# Patient Record
Sex: Female | Born: 1990 | Race: White | Hispanic: No | Marital: Married
Health system: Midwestern US, Community
[De-identification: ages and names within clinical notes are randomized; demographics above are authoritative.]

## PROBLEM LIST (undated history)

## (undated) DIAGNOSIS — F4310 Post-traumatic stress disorder, unspecified: Secondary | ICD-10-CM

## (undated) DIAGNOSIS — J310 Chronic rhinitis: Secondary | ICD-10-CM

## (undated) DIAGNOSIS — J329 Chronic sinusitis, unspecified: Secondary | ICD-10-CM

## (undated) HISTORY — PX: TONSILLECTOMY: SUR1361

---

## 2010-06-30 LAB — URINALYSIS WITH MICROSCOPIC
Bilirubin, Urine: NEGATIVE
Blood, Urine: NEGATIVE
CASTS 2: NONE SEEN /lpf
Crystals, UA: NONE SEEN
Glucose, Ur: NEGATIVE mg/dl
Ketones, Urine: NEGATIVE
Leukocyte Esterase, Urine: NEGATIVE
MISCELLANEOUS 2: NONE SEEN
Nitrite, Urine: NEGATIVE
Protein, UA: NEGATIVE
Renal Epithelial, UA: NONE SEEN
Specific Gravity, Urine: 1.021 (ref 1.002–1.030)
Urobilinogen, Urine: 0.2 eu/dl (ref 0.0–1.0)
Yeast, UA: NONE SEEN
pH, UA: 7 (ref 5.0–9.0)

## 2010-06-30 LAB — CBC
Basophils: 0.3 % (ref 0.0–3.0)
Eosinophils: 1.1 % (ref 0.0–4.0)
Hematocrit: 40.4 % (ref 37.0–47.0)
Hemoglobin: 13.6 gm/dl (ref 12.0–16.0)
Lymphocytes: 31.8 % (ref 15.0–47.0)
MCH: 29.5 pg (ref 27.0–31.0)
MCHC: 33.7 gm/dl (ref 33.0–37.0)
MCV: 87.6 fL (ref 81.0–99.0)
MPV: 9.1 mcm (ref 7.4–10.4)
Monocytes: 8.6 % (ref 0.0–12.0)
Nucleated Red Blood Cells: 0 /100 wbc
Platelets: 254 10*3/uL (ref 130–400)
RBC Morphology: NORMAL
RBC: 4.61 10*6/uL (ref 4.20–5.40)
RDW: 14.3 % (ref 11.5–14.5)
Seg Neutrophils: 58.2 % (ref 43.0–75.0)
WBC: 7.6 10*3/uL (ref 4.8–10.8)

## 2010-06-30 LAB — BASIC METABOLIC PANEL WITHOUT CALCIUM
BUN, WHOLE BLOOD: 10 mg/dl (ref 7–22)
CO2, WHOLE BLOOD: 22 meq/l — ABNORMAL LOW (ref 23–33)
CREATININE, WHOLE BLOOD: 1.2 mg/dl (ref 0.5–1.2)
Chloride, Whole Blood: 107 meq/l (ref 98–111)
Glucose, Whole Blood: 74 mg/dl (ref 70–108)
Potassium, Whole Blood: 3.6 meq/l (ref 3.5–5.2)
Sodium, Whole Blood: 142 meq/l (ref 135–145)

## 2010-06-30 LAB — ANION GAP: Anion Gap: 20 (ref 10.0–20.0)

## 2010-06-30 LAB — OSMOLALITY: Osmolality Calc: 281 (ref 275–300)

## 2010-06-30 LAB — HCG, SERUM, QUALITATIVE: Preg, Serum: NEGATIVE

## 2010-07-04 LAB — CHLAMYDIA CULTURE

## 2012-01-18 MED ORDER — DEXLANSOPRAZOLE 60 MG PO CPDR
60 MG | ORAL_CAPSULE | Freq: Every day | ORAL | Status: DC
Start: 2012-01-18 — End: 2012-01-25

## 2012-01-18 NOTE — Progress Notes (Addendum)
Subjective:      Patient ID: Jennifer Burch is a 21 y.o. female.  Chief Complaint   Patient presents with   ??? Poison Lajoyce [place]     has really bad poison ivy on her right fore arm and is spreading onto the left arm   ??? Abdominal Pain     states of being diagnosed down in [place] of having gastritis. is using Prilosec and that is not really helping   ??? Referral - General     would like to go and see someone for her gastro problems         HPI here for stomach pain  , bleeding rectally, dark red for the past 3 to 4 months diagnosed as gastritis, nothing has been scoped yet ,fine rash on her forearms for the past 2 weeks, pain on palpation of the midepigastric area BS is positive  Patient's medications, allergies, past medical, surgical, social and family histories were reviewed and updated as appropriate.    Filed Vitals:    01/18/12 1410   BP: 120/70   Pulse: 84   Temp: 98.8 ??F (37.1 ??C)   TempSrc: Oral   Resp: 18   Height: 5\' 4"  (1.626 m)   Weight: 135 lb (61.236 kg)       Review of Systems   Constitutional: Negative.    HENT: Negative.    Eyes: Negative.    Respiratory: Negative.    Cardiovascular: Negative.    Gastrointestinal: Positive for abdominal pain, blood in stool, abdominal distention and rectal pain.   Endocrine: Negative.    Musculoskeletal: Negative.    Skin: Negative.    Allergic/Immunologic: Negative.    Neurological: Negative.    Hematological: Negative.    Psychiatric/Behavioral: Negative.    All other systems reviewed and are negative.        Objective:   Physical Exam   Nursing note and vitals reviewed.  Constitutional: She is oriented to person, place, and time. Vital signs are normal. She appears well-developed and well-nourished.   HENT:   Head: Normocephalic and atraumatic.   Right Ear: Hearing, tympanic membrane, external ear and ear canal normal.   Left Ear: Hearing, tympanic membrane, external ear and ear canal normal.   Nose: Nose normal.   Mouth/Throat: Uvula is midline, oropharynx is clear and  moist and mucous membranes are normal.   Eyes: Conjunctivae and EOM are normal. Pupils are equal, round, and reactive to light.   Neck: Normal range of motion. Neck supple.   Cardiovascular: Normal rate, regular rhythm, S1 normal, S2 normal, normal heart sounds and intact distal pulses.  Exam reveals no gallop and no friction rub.    No murmur heard.  Pulmonary/Chest: Effort normal and breath sounds normal. She has no wheezes. She has no rhonchi. She has no rales.   Abdominal: Soft. Bowel sounds are normal. She exhibits distension. There is tenderness. There is guarding. There is no rebound.   Musculoskeletal: Normal range of motion.   Neurological: She is alert and oriented to person, place, and time. She has normal reflexes.   Skin: Skin is warm, dry and intact.   Psychiatric: She has a normal mood and affect. Her behavior is normal. Judgment and thought content normal.       Assessment:      Acute abdominal pain, rectal bleeding      Plan:      Decadron 4 mg,+ B12,1000 mcg, lab work ordered, Dexilant 60 mg daily, calmine on the  rash rtc tomorrow until she gets blood drawn

## 2012-01-18 NOTE — Telephone Encounter (Signed)
Samples of Dexilant 60 mg were given to the patient, Lot Number [id number], 08/2013 #5, lot # [id number] 08/2012 #10, lot# [id number] 09/2012 #5.

## 2012-01-18 NOTE — Progress Notes (Signed)
Have you seen any other physician or provider since your last visit no    Have you had any other diagnostic tests since your last visit? no    Have you changed or stopped any medications since your last visit including any over-the-counter medicines, vitamins, or herbal medicines? no     Are you taking all your prescribed medications? Yes    If NO, why?

## 2012-01-18 NOTE — Addendum Note (Signed)
Addended by: Rudene ChristiansBURNER, MELONIE on: 01/18/2012 02:43 PM     Modules accepted: Orders

## 2012-01-25 MED ORDER — DEXLANSOPRAZOLE 60 MG PO CPDR
60 MG | ORAL_CAPSULE | Freq: Every day | ORAL | Status: DC
Start: 2012-01-25 — End: 2012-05-03

## 2012-01-25 MED ORDER — SUCRALFATE 1 G PO TABS
1 GM | ORAL_TABLET | Freq: Four times a day (QID) | ORAL | Status: DC
Start: 2012-01-25 — End: 2012-05-03

## 2012-01-25 MED ORDER — METHYLPREDNISOLONE 4 MG PO TBPK
4 MG | PACK | ORAL | Status: AC
Start: 2012-01-25 — End: 2012-01-31

## 2012-01-25 MED ADMIN — cyanocobalamin injection 1,000 mcg: INTRAMUSCULAR | @ 14:00:00 | NDC 00517003125

## 2012-01-25 MED ADMIN — dexamethasone (DECADRON) injection 4 mg: INTRAMUSCULAR | @ 14:00:00 | NDC 00069454301

## 2012-01-25 NOTE — Progress Notes (Signed)
Subjective:      Patient ID: Jennifer Burch is a 21 y.o. female.  Chief Complaint   Patient presents with   ??? Check-Up     states of being here today for check up from Dexilant, which states is working really well   ??? Fatigue     c/o being really tired and moody all the time. states of feeling like she is PMS"ing all the time       HPIhere for follow up of her severe abdominal pain  That has improved using the Dexilant 60 mg, lab work is all normal ranges, pain scale now 1/10  Patient's medications, allergies, past medical, surgical, social and family histories were reviewed and updated as appropriate.    Filed Vitals:    01/25/12 0902   BP: 120/70   Pulse: 72   Resp: 18       Review of Systems   Constitutional: Negative.    HENT: Negative.    Eyes: Negative.    Respiratory: Negative.    Cardiovascular: Negative.    Gastrointestinal: Negative.  Negative for nausea, vomiting, abdominal pain, abdominal distention and rectal pain.   Endocrine: Negative.    Genitourinary: Negative.    Musculoskeletal: Negative.    Skin: Positive for color change, pallor and rash.   Allergic/Immunologic: Negative.    Neurological: Negative.    Hematological: Negative.    Psychiatric/Behavioral: Negative.    All other systems reviewed and are negative.        Objective:   Physical Exam   Nursing note and vitals reviewed.  Constitutional: She is oriented to person, place, and time. Vital signs are normal. She appears well-developed and well-nourished.   HENT:   Head: Normocephalic and atraumatic.   Right Ear: Hearing, tympanic membrane, external ear and ear canal normal.   Left Ear: Hearing, tympanic membrane, external ear and ear canal normal.   Nose: Nose normal.   Mouth/Throat: Uvula is midline, oropharynx is clear and moist and mucous membranes are normal.   Eyes: Conjunctivae and EOM are normal. Pupils are equal, round, and reactive to light.   Neck: Normal range of motion. Neck supple.   Cardiovascular: Normal rate, regular rhythm, S1  normal, S2 normal, normal heart sounds and intact distal pulses.  Exam reveals no gallop and no friction rub.    No murmur heard.  Pulmonary/Chest: Effort normal and breath sounds normal. She has no wheezes. She has no rhonchi. She has no rales.   Abdominal: Soft. Bowel sounds are normal. She exhibits no distension. There is no tenderness. There is no rebound and no guarding.   Musculoskeletal: Normal range of motion.   Neurological: She is alert and oriented to person, place, and time. She has normal reflexes.   Skin: Skin is warm, dry and intact. Rash noted. There is erythema.   Psychiatric: She has a normal mood and affect. Her behavior is normal. Judgment and thought content normal.       Assessment:      Acute gastritis, dermatitis of the arms      Plan:      Decadron 4 mg + B12,1000 mcg Im, medrol 4 mg dosepack

## 2012-01-25 NOTE — Telephone Encounter (Signed)
Patient is requesting a medication refill for pended orders. Patient was in office today and forgot to ask for refills

## 2012-01-25 NOTE — Progress Notes (Signed)
Have you seen any other physician or provider since your last visit no    Have you had any other diagnostic tests since your last visit? no    Have you changed or stopped any medications since your last visit including any over-the-counter medicines, vitamins, or herbal medicines? no     Are you taking all your prescribed medications? Yes    If NO, why?

## 2012-01-25 NOTE — Progress Notes (Signed)
After obtaining consent, and per orders of Dr. Biery, injection of vitamin b12 along with dexamethasone given into right gluteal by Melissa D Offenbacher.

## 2012-04-24 NOTE — Telephone Encounter (Signed)
x1 attempt to call patient, but the voice keeps saying "Sorry your call cannot be completed as dialed."

## 2012-04-24 NOTE — Telephone Encounter (Signed)
We should see her

## 2012-04-24 NOTE — Telephone Encounter (Signed)
Mom called into office voicemail stating that patient is having stomach pains and issues with her stomach again and doesn't know what to do? Do you want her to come in for an appt or should she go to the ER? She wanted a phone call back as soon as we could. [phone]    Attempted to call patient back, but mail box is full, so I was unable to leave a message. So will await their phone call back.

## 2012-05-03 MED ORDER — ONDANSETRON HCL 4 MG PO TABS
4 MG | ORAL_TABLET | Freq: Three times a day (TID) | ORAL | Status: DC | PRN
Start: 2012-05-03 — End: 2015-10-07

## 2012-05-03 MED ORDER — DEXLANSOPRAZOLE 60 MG PO CPDR
60 MG | ORAL_CAPSULE | Freq: Every day | ORAL | Status: DC
Start: 2012-05-03 — End: 2015-10-07

## 2012-05-03 MED ORDER — SUCRALFATE 1 G PO TABS
1 GM | ORAL_TABLET | Freq: Four times a day (QID) | ORAL | Status: DC
Start: 2012-05-03 — End: 2015-10-07

## 2012-05-03 NOTE — Telephone Encounter (Signed)
I reordered the Dexilant 60 mg, Carafate 1gm, & the Zofran 4 mg to be called in down in [place]

## 2012-05-03 NOTE — Telephone Encounter (Signed)
Called [phone] and spoke to the patient. I instructed her that Dr London SheerBiery would like to see her. She was hoping that he could [facility] [place] [address]  I told her I don't think we realized that she was in [place]. I asked her if she had a PCP down there, and she said NO. I instructed her that she might want to start looking for one.    I told her we would be in touch with her with in the next 24 hours.    Please let me know what to do.

## 2012-05-03 NOTE — Telephone Encounter (Signed)
Called the medications ordered by Dr London SheerBiery Dexilant 60 mg, Carafate 1 gm, and Zofran 4 mg [facility] [place]. Spoke with pharmacist Deepa with all the scripts.    1450 pm spoke with patient to let her know that Dr London SheerBiery ordered the above medications and that [facility] [place]. Stated that I had been trying to get a hold of her, and she stated that she had been having problems with her phone, I also stated that I had called her this am, and she stated that she could hear me, but she went through a dead zone, and then she lost me. Then that is when she called back into the office and spoke with Ginny.     Also got patient set up for my chart and stated that where she lived, it would be easy for her to my charted us with any of her information. She stated that would be great and so now she is a my chart patient.

## 2012-05-04 NOTE — Telephone Encounter (Signed)
Error

## 2013-07-11 ENCOUNTER — Emergency Department (HOSPITAL_COMMUNITY)
Admission: EM | Admit: 2013-07-11 | Discharge: 2013-07-11 | Disposition: A | Discharge status: Home / Self Care | Payer: Self-pay | Attending: Emergency Medicine | Admitting: Emergency Medicine

## 2013-07-11 ENCOUNTER — Emergency Department (HOSPITAL_COMMUNITY): Payer: Self-pay

## 2013-07-11 ENCOUNTER — Encounter (HOSPITAL_COMMUNITY): Payer: Self-pay | Admitting: Emergency Medicine

## 2013-07-11 DIAGNOSIS — Z7982 Long term (current) use of aspirin: Secondary | ICD-10-CM | POA: Insufficient documentation

## 2013-07-11 DIAGNOSIS — Z3202 Encounter for pregnancy test, result negative: Secondary | ICD-10-CM | POA: Insufficient documentation

## 2013-07-11 DIAGNOSIS — R1084 Generalized abdominal pain: Secondary | ICD-10-CM

## 2013-07-11 DIAGNOSIS — R52 Pain, unspecified: Secondary | ICD-10-CM | POA: Insufficient documentation

## 2013-07-11 DIAGNOSIS — K5900 Constipation, unspecified: Secondary | ICD-10-CM

## 2013-07-11 DIAGNOSIS — Z79899 Other long term (current) drug therapy: Secondary | ICD-10-CM | POA: Insufficient documentation

## 2013-07-11 DIAGNOSIS — R112 Nausea with vomiting, unspecified: Secondary | ICD-10-CM | POA: Insufficient documentation

## 2013-07-11 DIAGNOSIS — N83209 Unspecified ovarian cyst, unspecified side: Secondary | ICD-10-CM

## 2013-07-11 LAB — URINALYSIS, ROUTINE W REFLEX MICROSCOPIC
Bilirubin Urine: NEGATIVE
Glucose, UA: NEGATIVE mg/dL
Ketones, ur: NEGATIVE mg/dL
Specific Gravity, Urine: 1.03 (ref 1.005–1.030)
pH: 5.5 (ref 5.0–8.0)

## 2013-07-11 LAB — CBC WITH DIFFERENTIAL/PLATELET
Basophils Absolute: 0 10*3/uL (ref 0.0–0.1)
Basophils Relative: 0 % (ref 0–1)
Hemoglobin: 13.5 g/dL (ref 12.0–15.0)
MCHC: 35.2 g/dL (ref 30.0–36.0)
MCV: 86.3 fL (ref 78.0–100.0)
Monocytes Relative: 8 % (ref 3–12)
Neutro Abs: 3.2 10*3/uL (ref 1.7–7.7)
Neutrophils Relative %: 51 % (ref 43–77)
Platelets: 246 10*3/uL (ref 150–400)
RDW: 12.3 % (ref 11.5–15.5)

## 2013-07-11 LAB — COMPREHENSIVE METABOLIC PANEL
AST: 14 U/L (ref 0–37)
Albumin: 3.9 g/dL (ref 3.5–5.2)
Alkaline Phosphatase: 53 U/L (ref 39–117)
BUN: 13 mg/dL (ref 6–23)
CO2: 22 mEq/L (ref 19–32)
Chloride: 104 mEq/L (ref 96–112)
GFR calc non Af Amer: >90 mL/min (ref >90)
Potassium: 3.7 mEq/L (ref 3.5–5.1)
Sodium: 137 mEq/L (ref 135–145)
Total Bilirubin: 0.4 mg/dL (ref 0.3–1.2)
Total Protein: 7 g/dL (ref 6.0–8.3)

## 2013-07-11 LAB — URINE MICROSCOPIC-ADD ON

## 2013-07-11 LAB — POCT PREGNANCY, URINE: Preg Test, Ur: NEGATIVE

## 2013-07-11 LAB — WET PREP, GENITAL: Trich, Wet Prep: NONE SEEN

## 2013-07-11 MED ORDER — ONDANSETRON HCL 4 MG/2ML IJ SOLN
4.0000 mg | Freq: Once | INTRAMUSCULAR | Status: AC
  Administered 2013-07-11: 4 mg via INTRAVENOUS
  Filled 2013-07-11: qty 2

## 2013-07-11 MED ORDER — ELETRIPTAN HYDROBROMIDE 20 MG PO TABS: 20.0000 mg | ORAL_TABLET | ORAL | Status: AC | PRN

## 2013-07-11 MED ORDER — OXYCODONE-ACETAMINOPHEN 5-325 MG PO TABS: 2.0000 | ORAL_TABLET | ORAL | Status: AC | PRN

## 2013-07-11 MED ORDER — PANTOPRAZOLE SODIUM 40 MG IV SOLR
80.0000 mg | Freq: Once | INTRAVENOUS | Status: AC
  Administered 2013-07-11: 18:00:00 80 mg via INTRAVENOUS
  Filled 2013-07-11: qty 80

## 2013-07-11 MED ORDER — HYDROMORPHONE HCL PF 1 MG/ML IJ SOLN
1.0000 mg | Freq: Once | INTRAMUSCULAR | Status: AC
  Administered 2013-07-11: 1 mg via INTRAVENOUS
  Filled 2013-07-11: qty 1

## 2013-07-11 MED ORDER — IOHEXOL 300 MG/ML  SOLN
100.0000 mL | Freq: Once | INTRAMUSCULAR | Status: AC | PRN
  Administered 2013-07-11: 100 mL via INTRAVENOUS

## 2013-07-11 MED ORDER — IOHEXOL 300 MG/ML  SOLN
50.0000 mL | Freq: Once | INTRAMUSCULAR | Status: AC | PRN
  Administered 2013-07-11: 50 mL via ORAL

## 2013-07-11 MED ORDER — OMEPRAZOLE 20 MG PO CPDR: 20.0000 mg | DELAYED_RELEASE_CAPSULE | Freq: Every day | ORAL | Status: AC

## 2013-07-11 MED ORDER — SODIUM CHLORIDE 0.9 % IV BOLUS (SEPSIS)
500.0000 mL | Freq: Once | INTRAVENOUS | Status: AC
  Administered 2013-07-11: 500 mL via INTRAVENOUS

## 2013-07-11 MED ORDER — ONDANSETRON 8 MG PO TBDP: ORAL_TABLET | ORAL | Status: AC

## 2013-07-11 NOTE — ED Notes (Signed)
Pt c/o RLQ pain that started around 9pm two nights ago. Pt doesn't know if its appendicitis or cyst on her ovary bc she had cyst in past but doenst recall which ovary the cyst was on.  Pt states that she had nausea/vomiting two days ago when pain started but none in last 24 hours.

## 2013-07-11 NOTE — ED Notes (Signed)
Patient transported to CT 

## 2013-07-11 NOTE — ED Provider Notes (Signed)
Medical screening examination/treatment/procedure(s) were conducted as a shared visit with non-physician practitioner(s) and myself.  I personally evaluated the patient during the encounter.  EKG Interpretation   None      No acute abdomen. Pelvic exam normal. CT scan shows no evidence of appendicitis.  Donnetta Hutching, MD 07/11/13 2239

## 2013-07-11 NOTE — ED Notes (Signed)
Drinking oral contrast 

## 2013-07-11 NOTE — ED Provider Notes (Signed)
CSN: [id number]     Arrival date & time 07/11/13  1426 History   First MD Initiated Contact with Patient 07/11/13 1531     Chief Complaint  Patient presents with  . Abdominal Pain   (Consider location/radiation/quality/duration/timing/severity/associated sxs/prior Treatment) The history is provided by the patient and medical records. No language interpreter was used.    Joy Harris is a 22 y.o. female  with a Hx of Migraine, R ovarian cyst presents to the Emergency Department complaining of gradual, persistent, progressively worsening generalized abd pain onset 2 days ago with associated nausea and pink tinged emesis x3 but no frank blood. Pain is different from previous hemorrhagic cyst; beginning in the epigastrium like her "ulcer pain" and then radiating throughout the abd.  Pt reports her diagnosis of PUD was 3 years ago, was due to NSAID usage and she has not been taking the prilosec in the last several month.  Pt also reports she also has a generalized headache consistent with her previous migraines beginning this morning.  Associated symptoms include nausea and vomiting.  Laying down makes it better and moving and walking makes it worse.  Pt denies fever, chills, neck pain, neck stiffness, CP, SOB, diarrhea, bloody stools, weakness, dizziness, syncope, dysuria.  Also denies melena, bright red blood per rectum or bloody stools. LMP Jun 27, 2013  History reviewed. No pertinent past medical history. Past Surgical History  Procedure Laterality Date  . Tonsillectomy     No family history on file. History  Substance Use Topics  . Smoking status: Never Smoker   . Smokeless tobacco: Never Used  . Alcohol Use: No   OB History   Grav Para Term Preterm Abortions TAB SAB Ect Mult Living                 Review of Systems  Constitutional: Negative for fever, diaphoresis, appetite change, fatigue and unexpected weight change.  HENT: Negative for mouth sores and trouble swallowing.    Respiratory: Negative for cough, chest tightness, shortness of breath, wheezing and stridor.   Cardiovascular: Negative for chest pain and palpitations.  Gastrointestinal: Positive for nausea, vomiting and abdominal pain. Negative for diarrhea, constipation, blood in stool, abdominal distention and rectal pain.  Genitourinary: Negative for dysuria, urgency, frequency, hematuria, flank pain and difficulty urinating.  Musculoskeletal: Negative for back pain, neck pain and neck stiffness.  Skin: Negative for rash.  Neurological: Negative for weakness.  Hematological: Negative for adenopathy.  Psychiatric/Behavioral: Negative for confusion.  All other systems reviewed and are negative.    Allergies  Review of patient's allergies indicates no known allergies.  Home Medications   Current Outpatient Rx  Name  Route  Sig  Dispense  Refill  . aspirin 325 MG tablet   Oral   Take 650 mg by mouth daily.         Marland Kitchen eletriptan (RELPAX) 20 MG tablet   Oral   Take 1 tablet (20 mg total) by mouth as needed for migraine or headache. One tablet by mouth at onset of headache. May repeat in 2 hours if headache persists or recurs.   2 tablet   3   . omeprazole (PRILOSEC) 20 MG capsule   Oral   Take 1 capsule (20 mg total) by mouth daily.   30 capsule   0   . ondansetron (ZOFRAN ODT) 8 MG disintegrating tablet      8mg  ODT q4 hours prn nausea   10 tablet   0   .  oxyCODONE-acetaminophen (PERCOCET/ROXICET) 5-325 MG per tablet   Oral   Take 2 tablets by mouth every 4 (four) hours as needed for severe pain.   6 tablet   0    BP 111/65  Pulse 72  Temp(Src) 98.2 F (36.8 C) (Oral)  Resp 16  SpO2 100%  LMP 06/27/2013 Physical Exam  Nursing note and vitals reviewed. Constitutional: She is oriented to person, place, and time. She appears well-developed and well-nourished.  HENT:  Head: Normocephalic and atraumatic.  Mouth/Throat: Oropharynx is clear and moist.  Eyes: Conjunctivae are  normal. No scleral icterus.  Neck: Normal range of motion.  Cardiovascular: Normal rate, regular rhythm, normal heart sounds and intact distal pulses.   No murmur heard. Pulmonary/Chest: Effort normal and breath sounds normal. No respiratory distress. She has no wheezes.  Abdominal: Soft. Normal appearance and bowel sounds are normal. She exhibits no distension and no mass. There is generalized tenderness. There is guarding. There is no rebound and no CVA tenderness. Hernia confirmed negative in the right inguinal area and confirmed negative in the left inguinal area.  Patient exquisitely tender to palpation throughout the abdomen Significant pain in the abdomen with heelstrike No rebound tenderness No CVA tenderness  Genitourinary: There is no rash, tenderness, lesion or injury on the right labia. There is no rash, tenderness, lesion or injury on the left labia. Uterus is not deviated, not enlarged, not fixed and not tender. Cervix exhibits no motion tenderness, no discharge and no friability. Right adnexum displays no mass, no tenderness and no fullness. Left adnexum displays no mass, no tenderness and no fullness. No erythema, tenderness or bleeding around the vagina. No foreign body around the vagina. No signs of injury around the vagina. Vaginal discharge ( minimal, white, non odorous - consistent with leukorrhea) found.  Musculoskeletal: Normal range of motion. She exhibits no edema.  Lymphadenopathy:    She has no cervical adenopathy.       Right: No inguinal adenopathy present.       Left: No inguinal adenopathy present.  Neurological: She is alert and oriented to person, place, and time. No cranial nerve deficit.  Skin: Skin is warm and dry. No rash noted. No erythema.  Psychiatric: She has a normal mood and affect. Her behavior is normal.    ED Course  Procedures (including critical care time) Labs Review Labs Reviewed  WET PREP, GENITAL - Abnormal; Notable for the following:     Clue Cells Wet Prep HPF POC MANY (*)    WBC, Wet Prep HPF POC MANY (*)    All other components within normal limits  URINALYSIS, ROUTINE W REFLEX MICROSCOPIC - Abnormal; Notable for the following:    APPearance CLOUDY (*)    Protein, ur 30 (*)    Leukocytes, UA TRACE (*)    All other components within normal limits  URINE MICROSCOPIC-ADD ON - Abnormal; Notable for the following:    Squamous Epithelial / LPF MANY (*)    Bacteria, UA MANY (*)    Crystals CA OXALATE CRYSTALS (*)    All other components within normal limits  GC/CHLAMYDIA PROBE AMP  CBC WITH DIFFERENTIAL  COMPREHENSIVE METABOLIC PANEL  LIPASE, BLOOD  POCT PREGNANCY, URINE   Imaging Review Ct Abdomen Pelvis W Contrast  07/11/2013   CLINICAL DATA:  Right lower quadrant abdominal pain. Nausea. History of ovarian cysts.  EXAM: CT ABDOMEN AND PELVIS WITH CONTRAST  TECHNIQUE: Multidetector CT imaging of the abdomen and pelvis was performed using the standard  protocol following bolus administration of intravenous contrast.  CONTRAST:  50mL OMNIPAQUE IOHEXOL 300 MG/ML SOLN, OMNIPAQUE IOHEXOL 300 MG/ML SOLN  COMPARISON:  Acute abdomen series same date.  FINDINGS: Lower Chest: Minimal subpleural irregularity at the right lower lobe, likely due to a subpleural lymph node. Normal heart size without pericardial or pleural effusion.  Abdomen/Pelvis: Normal liver, spleen, stomach, pancreas, gallbladder, biliary tract, adrenal glands, kidneys.  Circumaortic left renal vein. No retroperitoneal or retrocrural adenopathy. Colonic stool burden suggests constipation. The cecum extends into the upper pelvis. The appendix may be present on images 53 and 54. No right lower quadrant inflammation seen. Poor enteric opacification of distal small bowel loops an cecum.  Small bowel otherwise within normal limits, without ascites.  No pelvic adenopathy. Normal urinary bladder and uterus. A left ovarian corpus luteal cyst measures 2.3 cm on image 62. Small  volume cul-de-sac fluid could be physiologic or relate to recent cyst rupture.  Bones/Musculoskeletal:  No acute osseous abnormality.  IMPRESSION: 1. Left ovarian corpus luteal cyst. Small volume fluid within the pelvis could be physiologic or due to recent cyst rupture. 2. No other definite explanation for right-sided pain. The appendix is suboptimally visualized, but no definite right lower quadrant inflammation is seen. If there is a high concern of appendicitis, repeat imaging after better opacification of the distal small bowel and colon should be considered. 3.  Possible constipation.   Electronically Signed   By: Jeronimo Greaves M.D.   On: 07/11/2013 19:24   Dg Abd Acute W/chest  07/11/2013   CLINICAL DATA:  Right lower quadrant pain.  EXAM: ACUTE ABDOMEN SERIES (ABDOMEN 2 VIEW & CHEST 1 VIEW)  COMPARISON:  None.  FINDINGS: Mediastinum and hilar structures are normal. Lungs are clear. Heart size normal.  Stool is noted throughout the colon. This could be secondary constipation. No bowel distention or free air. Mild scoliosis concave left noted.  IMPRESSION: 1. Stool noted throughout the colon suggesting constipation. 2. No acute abnormality otherwise noted.  The chest is clear.   Electronically Signed   By: Maisie Fus  Register   On: 07/11/2013 17:36    EKG Interpretation   None       MDM   1. Abdominal pain, acute, generalized   2. Constipation   3. Ovarian cyst rupture      Joy Harris presents with generalized abd pain.  Concern for possible perforation 2/2 PUD vs appendicitis vs colitis.  Will obtain labs and imaging.    4:53 PM Pt without leukocytosis, or other abnormality on bloodwork.   Pelvic exam without adnexal mass or tenderness.  Will redose pain control.  CT scan pending.  8:16 PM UA without evidence of urinary tract infection, CBC, CMP and lipase unremarkable. Patient without leukocytosis. Acute abdomen without evidence of free air or constipation noted. CT scan with small  fluid volume within the pelvis potentially due to recent cyst rupture. Appendix is not well-visualized but there is no evidence of inflammation.  Wet prep with clue cells and many white blood cells however patient without cervical motion tenderness and denies vaginal irritation, vaginal discharge high-risk activities. No new sexual partners and patient is married.  Will send GC chlamydia cultures.  Due to low risk patient and benign pelvic exam will not treat at this time.  Patient is nontoxic, nonseptic appearing, in no apparent distress.  Patient's pain and other symptoms adequately managed in emergency department.  Fluid bolus given.  Labs, imaging and vitals reviewed.  Patient does not  meet the SIRS or Sepsis criteria.  On repeat exam patient does not have a surgical abdomin and there are no peritoneal signs.  No indication of appendicitis, bowel obstruction, bowel perforation, cholecystitis, diverticulitis, PID or ectopic pregnancy.  Patient discharged home with symptomatic treatment and given strict instructions for follow-up with their primary care physician.  I have also discussed reasons to return immediately to the ER.  Patient expresses understanding and agrees with plan.  It has been determined that no acute conditions requiring further emergency intervention are present at this time. The patient/guardian have been advised of the diagnosis and plan. We have discussed signs and symptoms that warrant return to the ED, such as changes or worsening in symptoms and I discussed early appendicitis warnings.   Vital signs are stable at discharge.   BP 111/65  Pulse 72  Temp(Src) 98.2 F (36.8 C) (Oral)  Resp 16  SpO2 100%  LMP 06/27/2013  Patient/guardian has voiced understanding and agreed to follow-up with the PCP or specialist.    The patient was discussed with and seen by Dr. Donnetta Hutching who agrees with the treatment plan.     Dahlia Client Xandra Laramee, PA-C 07/11/13 2020

## 2013-07-12 LAB — GC/CHLAMYDIA PROBE AMP: GC Probe RNA: NEGATIVE

## 2015-03-17 IMAGING — CT CT ABD-PELV W/ CM
1 of 2 series · 15 of 32 positions shown, 19 images · IV contrast (OMNIPAQUE 300)
Comparison: Acute abdomen series same date.

CLINICAL DATA: Right lower quadrant abdominal pain. Nausea. History
of ovarian cysts.

EXAM:
CT ABDOMEN AND PELVIS WITH CONTRAST
TECHNIQUE: Multidetector CT imaging of the abdomen and pelvis was performed
using the standard protocol following bolus administration of
intravenous contrast.
CONTRAST:  50mL OMNIPAQUE IOHEXOL 300 MG/ML SOLN, 100mL OMNIPAQUE
IOHEXOL 300 MG/ML SOLN

[Series 2: abd/pel with · axial · 0.72mm/px · z∈[+1147,+1532]mm · 15 of 85 slices shown, 19 images]
[im 4/85  soft-tissue]
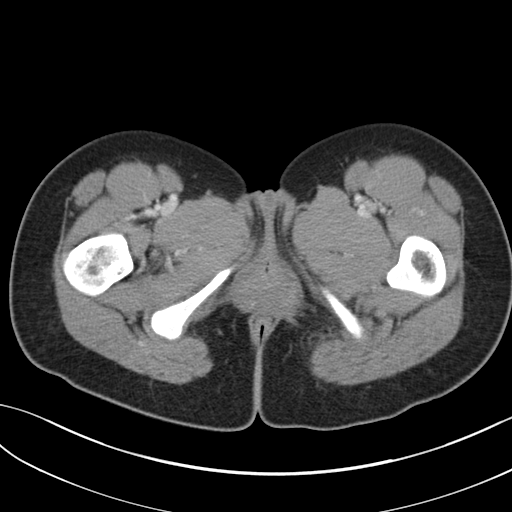
[im 4/85  bone]
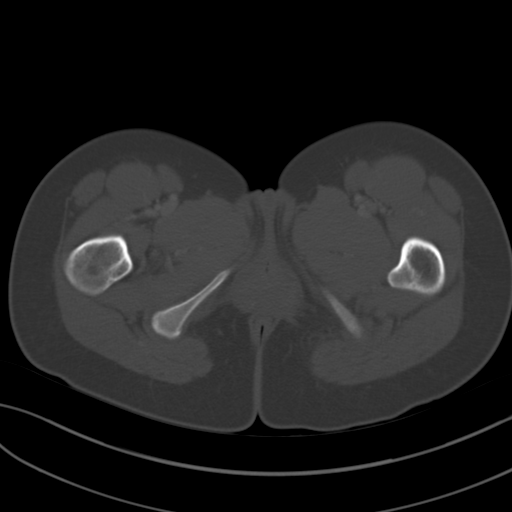
[im 10/85  soft-tissue]
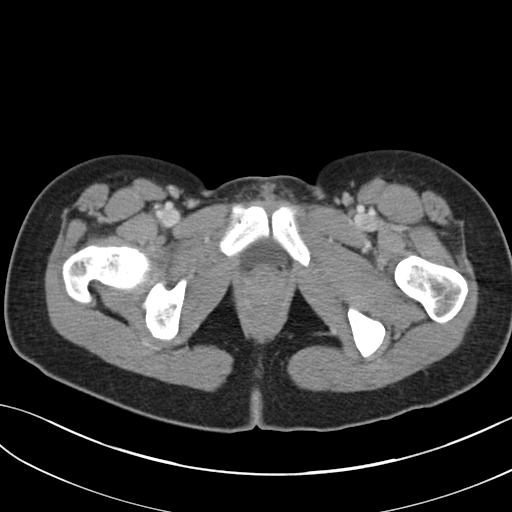
[im 17/85  soft-tissue]
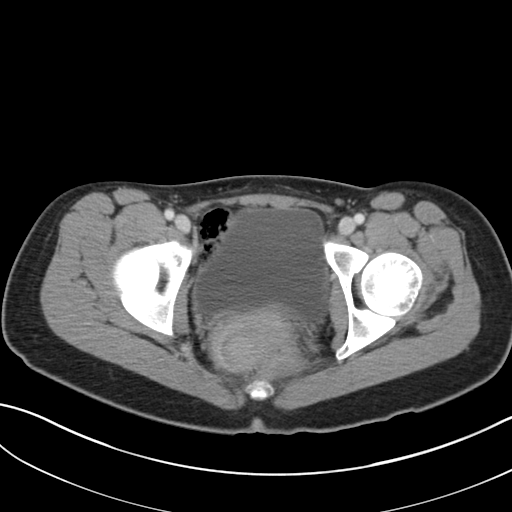
[im 23/85  soft-tissue]
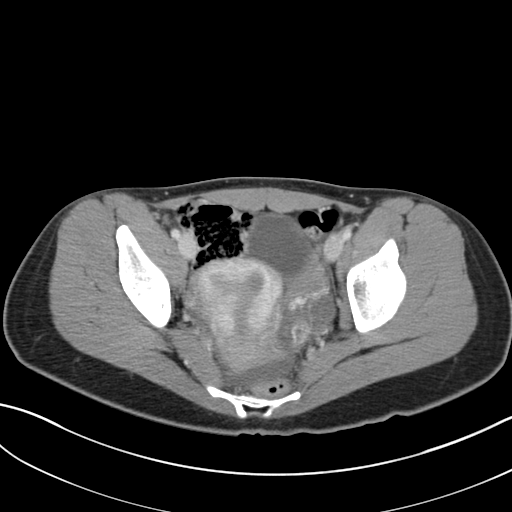
[im 30/85  soft-tissue]
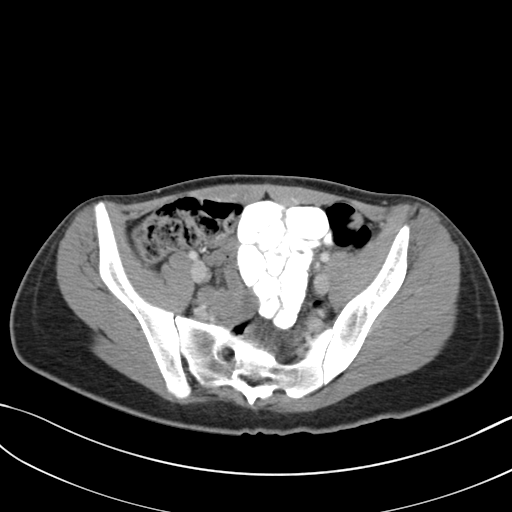
[im 36/85  soft-tissue]
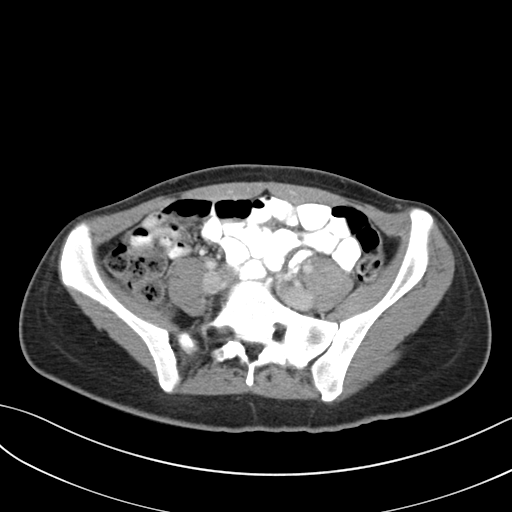
[im 43/85  soft-tissue]
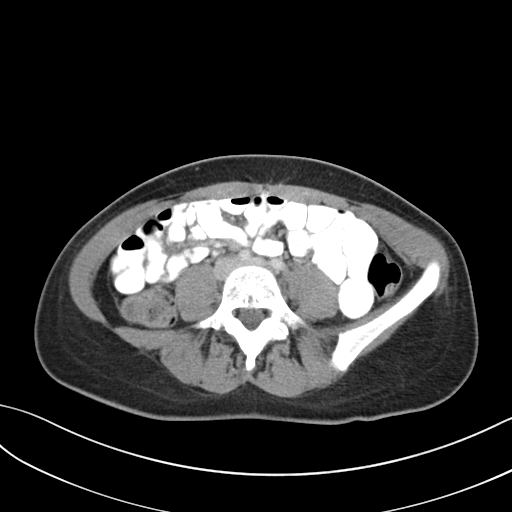
[im 49/85  soft-tissue]
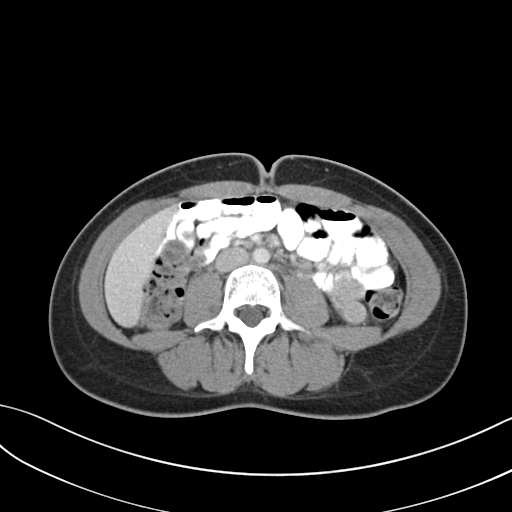
[im 55/85  soft-tissue]
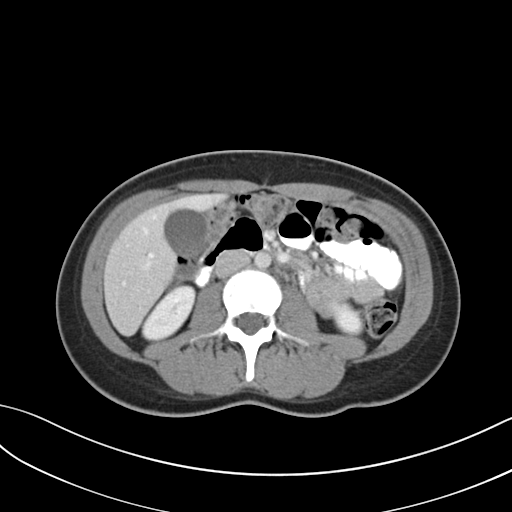
[im 55/85  bone]
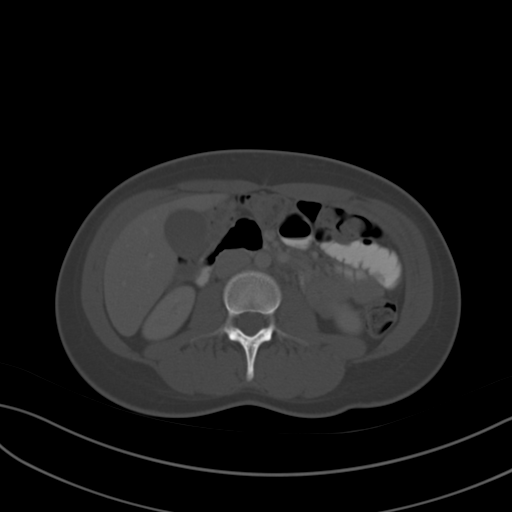
[im 62/85  soft-tissue]
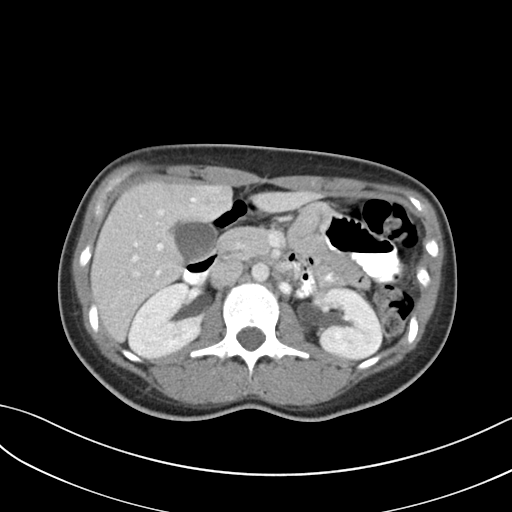
[im 68/85  soft-tissue]
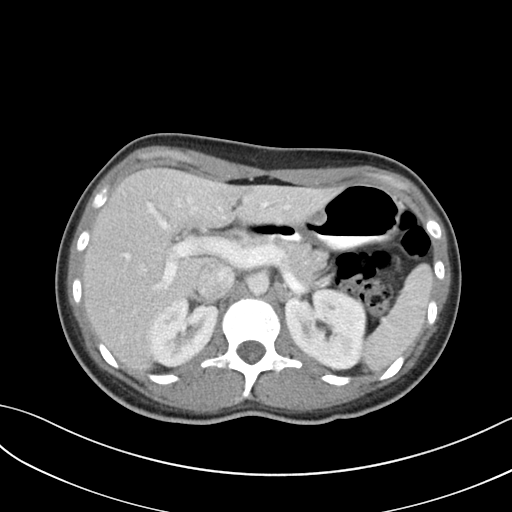
[im 72/85  lung]
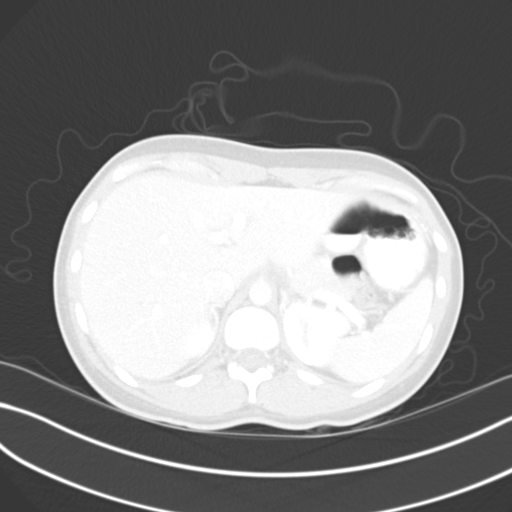
[im 75/85  soft-tissue]
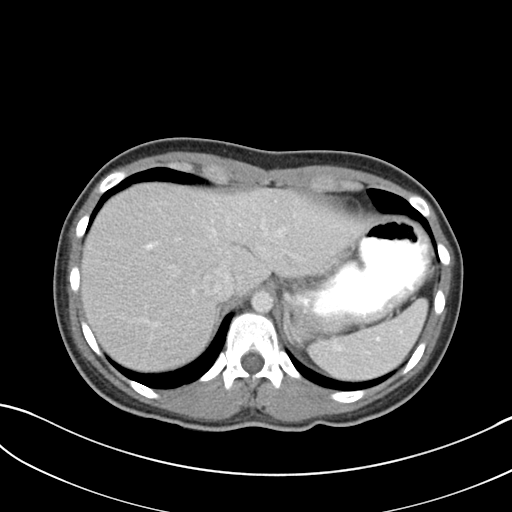
[im 75/85  lung]
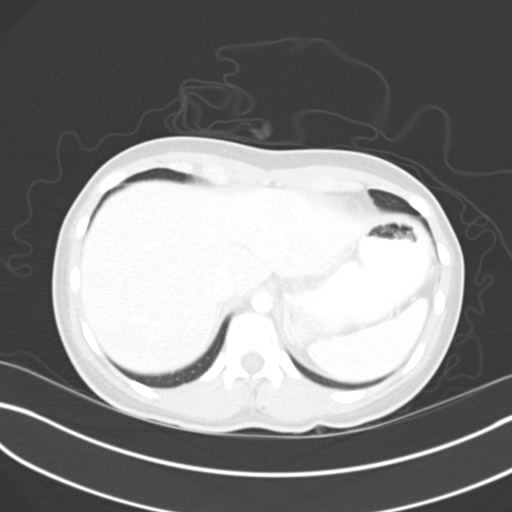
[im 78/85  lung]
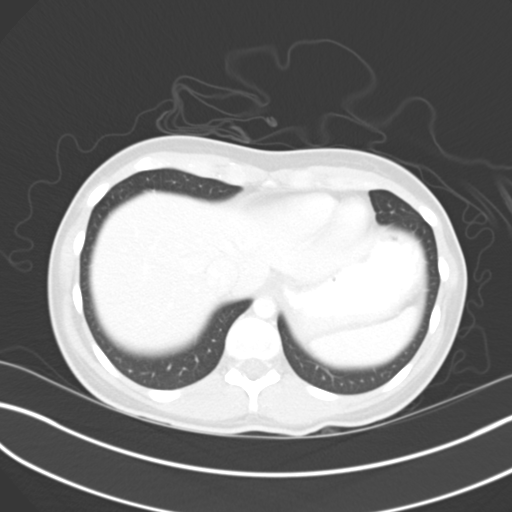
[im 81/85  soft-tissue]
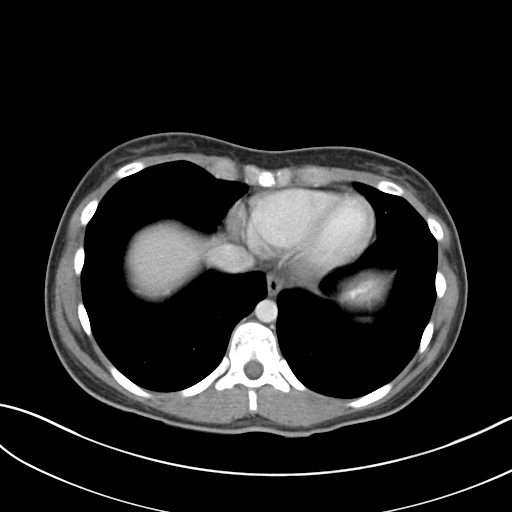
[im 81/85  lung]
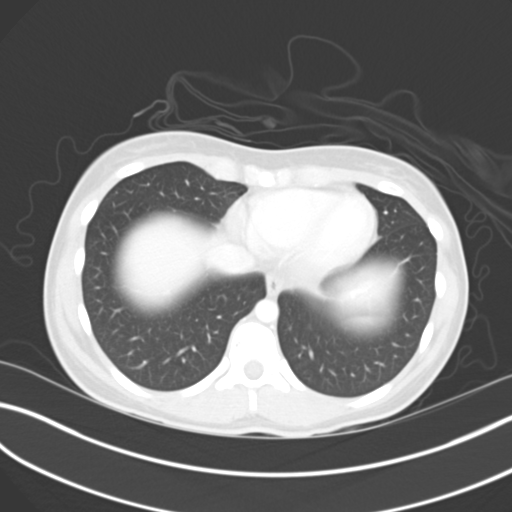

[15 of 32 positions shown; findings below may reference images not displayed]

FINDINGS: Lower Chest: Minimal subpleural irregularity at the right lower
lobe, likely due to a subpleural lymph node. Normal heart size
without pericardial or pleural effusion.

Abdomen/Pelvis: Normal liver, spleen, stomach, pancreas,
gallbladder, biliary tract, adrenal glands, kidneys.

Circumaortic left renal vein. No retroperitoneal or retrocrural
adenopathy. Colonic stool burden suggests constipation. The cecum
extends into the upper pelvis. The appendix may be present on images
53 and 54. No right lower quadrant inflammation seen. Poor enteric
opacification of distal small bowel loops an cecum.

Small bowel otherwise within normal limits, without ascites.

No pelvic adenopathy. Normal urinary bladder and uterus. A left
ovarian corpus luteal cyst measures 2.3 cm on image 62. Small volume
cul-de-sac fluid could be physiologic or relate to recent cyst
rupture.

Bones/Musculoskeletal:  No acute osseous abnormality.
IMPRESSION: 1. Left ovarian corpus luteal cyst. Small volume fluid within the
pelvis could be physiologic or due to recent cyst rupture.
2. No other definite explanation for right-sided pain. The appendix
is suboptimally visualized, but no definite right lower quadrant
inflammation is seen. If there is a high concern of appendicitis,
repeat imaging after better opacification of the distal small bowel
and colon should be considered.
3.  Possible constipation.

## 2015-10-02 ENCOUNTER — Inpatient Hospital Stay: Admit: 2015-10-02 | Discharge: 2015-10-02 | Disposition: A | Discharge status: Home / Self Care

## 2015-10-02 DIAGNOSIS — B9689 Other specified bacterial agents as the cause of diseases classified elsewhere: Secondary | ICD-10-CM

## 2015-10-02 MED ORDER — FLUCONAZOLE 200 MG PO TABS
200 MG | ORAL_TABLET | Freq: Once | ORAL | 0 refills | Status: DC
Start: 2015-10-02 — End: 2015-10-07

## 2015-10-02 MED ORDER — METRONIDAZOLE 250 MG PO TABS
250 MG | ORAL_TABLET | Freq: Three times a day (TID) | ORAL | 0 refills | Status: DC
Start: 2015-10-02 — End: 2015-10-07

## 2015-10-02 NOTE — ED Provider Notes (Signed)
[facility]  Urgent Care Encounter      CHIEF COMPLAINT       Chief Complaint   Patient presents with   ??? Vaginal Itching       Nurses Notes reviewed and I agree except as noted in the HPI.  HISTORY OF PRESENT ILLNESS   Jennifer Burch is a 25 y.o. female who presents Patient is a 25 y.o. female presenting with vaginal discharge. The history is provided by the patient and the spouse.   Vaginal Discharge   Quality:  Watery, yellow, white and thin  Severity:  Moderate  Onset quality:  Sudden  Duration:  5 days  Timing:  Intermittent  Progression:  Worsening  Chronicity:  New  Context: after intercourse, after urination and at rest    Relieved by:  Nothing  Worsened by:  Exercise, position, urination and change in diet  Ineffective treatments:  Rest and OTC medications  Associated symptoms: dyspareunia and vaginal itching    Associated symptoms: no abdominal pain and no dysuria    Risk factors: prior miscarriage and unprotected sex    Risk factors: no new sexual partner, no PID, no STI, no STI exposure and no terminated pregnancy          REVIEW OF SYSTEMS     Review of Systems   Constitutional: Negative.    HENT: Negative.    Eyes: Negative.    Respiratory: Negative.    Cardiovascular: Negative.    Gastrointestinal: Negative for abdominal distention and abdominal pain.   Endocrine: Negative.    Genitourinary: Positive for dyspareunia, urgency and vaginal discharge. Negative for decreased urine volume, dysuria, frequency, genital sores, hematuria, menstrual problem, pelvic pain and vaginal pain.   Musculoskeletal: Negative.    Skin: Negative for pallor and rash.   Allergic/Immunologic: Negative for environmental allergies and food allergies.   Neurological: Negative.    Hematological: Negative for adenopathy.   Psychiatric/Behavioral: Negative.        PAST MEDICAL HISTORY   History reviewed. No pertinent past medical history.    SURGICAL HISTORY     Patient  has no past surgical history on  file.    CURRENT MEDICATIONS       Previous Medications    DEXLANSOPRAZOLE (DEXILANT) 60 MG CPDR CAPSULE    Take 1 capsule by mouth daily.    ONDANSETRON (ZOFRAN) 4 MG TABLET    Take 1 tablet by mouth every 8 hours as needed for Nausea.    SUCRALFATE (CARAFATE) 1 GM TABLET    Take 1 tablet by mouth 4 times daily.       ALLERGIES     Patient is has No Known Allergies.    FAMILY HISTORY     Patient's family history is not on file.    SOCIAL HISTORY     Patient  reports that she has never smoked. She has never used smokeless tobacco. She reports that she does not drink alcohol or use illicit drugs.    PHYSICAL EXAM     ED TRIAGE VITALS  BP: 122/72, Temp: 98.6 ??F (37 ??C), Pulse: 87, Resp: 18, SpO2: 99 %  Physical Exam   Constitutional: She is oriented to person, place, and time. She appears well-developed and well-nourished.   HENT:   Head: Normocephalic.   Nose: Nose normal.   Mouth/Throat: Oropharynx is clear and moist.   Eyes: Right eye exhibits no discharge. Left eye exhibits no discharge. No scleral icterus.   Neck: Normal  range of motion. No thyromegaly present.   Cardiovascular: Normal rate, regular rhythm, normal heart sounds and intact distal pulses.  Exam reveals no gallop and no friction rub.    No murmur heard.  Pulmonary/Chest: Effort normal and breath sounds normal. No stridor. No respiratory distress. She has no wheezes. She has no rales.   Abdominal: Soft. Bowel sounds are normal. She exhibits no distension and no mass. There is no tenderness.   Genitourinary: Vaginal discharge (watery) found.   Musculoskeletal: Normal range of motion. She exhibits no edema or tenderness.   Lymphadenopathy:     She has no cervical adenopathy.   Neurological: She is alert and oriented to person, place, and time.   Skin: Skin is warm and dry. No rash noted. No erythema.   Psychiatric: She has a normal mood and affect. Her behavior is normal. Judgment and thought content normal.   Nursing note and vitals  reviewed.      DIAGNOSTIC RESULTS   Labs:No results found for this visit on 10/02/15.    IMAGING:  No orders to display     URGENT CARE COURSE:     Vitals:    10/02/15 1547   BP: 122/72   Pulse: 87   Resp: 18   Temp: 98.6 ??F (37 ??C)   TempSrc: Temporal   SpO2: 99%   Weight: 138 lb 7.2 oz (62.8 kg)   Height: 5\' 6"  (1.676 m)       Medications - No data to display  PROCEDURES:  None  FINAL IMPRESSION      1. BV (bacterial vaginosis)    2. Vaginal candidiasis        DISPOSITION/PLAN   DISPOSITION Decision to Discharge  PATIENT REFERRED TO:  [facility]  [address]  [place] [address]  [phone]  In 3 days  As needed, If symptoms worsen    DISCHARGE MEDICATIONS:  New Prescriptions    FLUCONAZOLE (DIFLUCAN) 200 MG TABLET    Take 1 tablet by mouth once for 1 dose Take on March 1st, 2017    METRONIDAZOLE (FLAGYL) 250 MG TABLET    Take 1 tablet by mouth 3 times daily for 7 days     Current Discharge Medication List          Metro Kung, CNP        Metro Kung, CNP  10/02/15 1614

## 2015-10-02 NOTE — ED Triage Notes (Signed)
Patient ambulated to room with complaint of vaginal itch, burning and discharge. Patient denies exposure to STD. Patient also complains of rash on left forearm and left finger.

## 2015-10-06 NOTE — Telephone Encounter (Signed)
Patient mom Vernona Rieger is calling stating patient is having migraines bad and would like to become a patient of Eric's, mom is a patient. Please advise mom at [phone].

## 2015-10-06 NOTE — Telephone Encounter (Signed)
Ok to schedule.

## 2015-10-06 NOTE — Telephone Encounter (Signed)
Mom Vernona Rieger was notified and call was transferred to Pre Service to schedule the NP appt.

## 2015-10-07 ENCOUNTER — Ambulatory Visit: Admit: 2015-10-07 | Discharge: 2015-10-07 | Payer: PRIVATE HEALTH INSURANCE | Attending: Family | Primary: Family

## 2015-10-07 DIAGNOSIS — IMO0002 Reserved for concepts with insufficient information to code with codable children: Secondary | ICD-10-CM

## 2015-10-07 MED ORDER — ELETRIPTAN HYDROBROMIDE 40 MG PO TABS
40 MG | ORAL_TABLET | ORAL | 0 refills | Status: DC
Start: 2015-10-07 — End: 2015-11-04

## 2015-10-07 MED ORDER — TOPIRAMATE 25 MG PO TABS
25 MG | ORAL_TABLET | ORAL | 0 refills | Status: DC
Start: 2015-10-07 — End: 2015-11-04

## 2015-10-07 NOTE — Progress Notes (Signed)
Subjective:      Patient ID: Jennifer Burch is a 25 y.o. female.    HPI: NP to establish care. Former PCP was None    Past Medical History   Diagnosis Date   ??? Headache        History reviewed. No pertinent past surgical history.    Family History   Problem Relation Age of Onset   ??? Diabetes Maternal Aunt    ??? Heart Disease Maternal Grandmother        No current outpatient prescriptions on file.     No current facility-administered medications for this visit.        Social: Just moved from [facility]    Smoke: None    Cervical Cancer Screening - 2015    OBGYN - March 8th, 2017    Chief Complaint   Patient presents with   ??? Establish Care     NO previous PCP   ??? Migraine     Weekly, Was treated in the past but unsure regarding what medication or whom she saw       Chronic.  1-2 times per week.   Acute onset sharp pain/spots.  Whole head pain.  Will have N/V.  Light sensitive.  Stabbing pain.  Will on occasion last for 1-2 days.       HA will wake her up from sleep.  Denies thunderclap HA.     BP Readings from Last 3 Encounters:   10/07/15 112/76   10/02/15 122/72   01/25/12 120/70       BP wnl    No results found for: LABA1C  No results found for: EAG    No components found for: CHLPL  No results found for: TRIG  No results found for: HDL  No results found for: LDLCALC  No results found for: LABVLDL      Chemistry        Component Value Date/Time    NA 142 06/29/2010 1945    K 3.6 06/29/2010 1945    CREATININE 1.2 06/29/2010 1945    No results found for: CALCIUM, ALKPHOS, AST, ALT, BILITOT         No results found for: TSH, T3TOTAL, T4TOTAL, THYROIDAB    Lab Results   Component Value Date    WBC 7.6 06/29/2010    HCT 40.4 06/29/2010    MCV 87.6 06/29/2010    PLT 254 06/29/2010         Health Maintenance   Topic Date Due   ??? HIV screen  12/18/2003   ??? DTaP/Tdap/Td vaccine (1 - Tdap) 12/18/2007   ??? Cervical cancer screen  10/27/2014   ??? Flu vaccine (1) 03/09/2016 (Originally 03/10/2015)         There is no immunization history on  file for this patient.    Review of Systems   Constitutional: Negative for chills and fever.   Respiratory: Negative for cough and shortness of breath.    Cardiovascular: Negative for chest pain.   Gastrointestinal: Negative for abdominal pain and nausea.   Skin: Negative for rash.   Neurological: Positive for headaches. Negative for dizziness and light-headedness.   Psychiatric/Behavioral: Negative.        Objective:   Physical Exam   Constitutional: She is oriented to person, place, and time. Vital signs are normal. She appears well-developed and well-nourished. She is active. She does not have a sickly appearance. No distress.   HENT:   Right Ear: Tympanic membrane normal.  Left Ear: Tympanic membrane normal.   Nose: Nose normal.   Mouth/Throat: Oropharynx is clear and moist and mucous membranes are normal.   Cardiovascular: Normal rate, regular rhythm, S1 normal, S2 normal, normal heart sounds and normal pulses.  Exam reveals no S3.    No murmur heard.  Pulmonary/Chest: Effort normal and breath sounds normal. She has no decreased breath sounds. She has no wheezes. She has no rhonchi.   Abdominal: Soft. Bowel sounds are normal. There is no tenderness.   Neurological: She is alert and oriented to person, place, and time. She has normal strength. No cranial nerve deficit or sensory deficit. She displays a negative Romberg sign. Coordination and gait normal.   Psychiatric: She has a normal mood and affect. Her behavior is normal.       Assessment:      1. Chronic migraine  MRI Brain W WO Contrast    topiramate (TOPAMAX) 25 MG tablet    eletriptan (RELPAX) 40 MG tablet   2. Thunderclap headache  MRI Brain W WO Contrast   3. Visual changes  MRI Brain W WO Contrast           Plan:      Pmhx reviewed  MRI Brain - due to changing HA and waking up at HS  Topamax 50 mg HS  Relpax samples trial  Healthy Lifestyle  RTO in 1 month

## 2015-10-07 NOTE — Progress Notes (Signed)
Patient scheduled at [facility] for MRI with and without contrast on 10/14/15 at 12 noon.  Patient to arrive at 11:30am to main radiology.  Patient informed at appt

## 2015-10-07 NOTE — Patient Instructions (Signed)
Migraine Headache: Care Instructions  Your Care Instructions  Migraines are painful, throbbing headaches that often start on one side of the head. They may cause nausea and vomiting and make you sensitive to light, sound, or smell.  Without treatment, migraines can last from 4 hours to a few days. Medicines can help prevent migraines or stop them after they have started. Your doctor can help you find which ones work best for you.  Follow-up care is a key part of your treatment and safety. Be sure to make and go to all appointments, and call your doctor if you are having problems. It's also a good idea to know your test results and keep a list of the medicines you take.  How can you care for yourself at home?  ?? Do not drive if you have taken a prescription pain medicine.  ?? Rest in a quiet, dark room until your headache is gone. Close your eyes, and try to relax or go to sleep. Don't watch TV or read.  ?? Put a cold, moist cloth or cold pack on the painful area for 10 to 20 minutes at a time. Put a thin cloth between the cold pack and your skin.  ?? Use a warm, moist towel or a heating pad set on low to relax tight shoulder and neck muscles.  ?? Have someone gently massage your neck and shoulders.  ?? Take your medicines exactly as prescribed. Call your doctor if you think you are having a problem with your medicine. You will get more details on the specific medicines your doctor prescribes.  ?? Be careful not to take pain medicine more often than the instructions allow. You could get worse or more frequent headaches when the medicine wears off.  To prevent migraines  ?? Keep a headache diary so you can figure out what triggers your headaches. Avoiding triggers may help you prevent headaches. Record when each headache began, how long it lasted, and what the pain was like. (Was it throbbing, aching, stabbing, or dull?) Write down any other symptoms you had with the headache, such as nausea, flashing lights or dark  spots, or sensitivity to bright light or loud noise. Note if the headache occurred near your period. List anything that might have triggered the headache. Triggers may include certain foods (chocolate, cheese, wine) or odors, smoke, bright light, stress, or lack of sleep.  ?? If your doctor has prescribed medicine for your migraines, take it as directed. You may have medicine that you take only when you get a migraine and medicine that you take all the time to help prevent migraines.  ?? If your doctor has prescribed medicine for when you get a headache, take it at the first sign of a migraine, unless your doctor has given you other instructions.  ?? If your doctor has prescribed medicine to prevent migraines, take it exactly as prescribed. Call your doctor if you think you are having a problem with your medicine.  ?? Find healthy ways to deal with stress. Migraines are most common during or right after stressful times. Take time to relax before and after you do something that has caused a migraine in the past.  ?? Try to keep your muscles relaxed by keeping good posture. Check your jaw, face, neck, and shoulder muscles for tension. Try to relax them. When you sit at a desk, change positions often. And make sure to stretch for 30 seconds each hour.  ?? Get plenty of sleep and   exercise.  ?? Eat meals on a regular schedule. Avoid foods and drinks that often trigger migraines. These include chocolate, alcohol (especially red wine and port), aspartame, monosodium glutamate (MSG), and some additives found in foods (such as hot dogs, bacon, cold cuts, aged cheeses, and pickled foods).  ?? Limit caffeine. Don't drink too much coffee, tea, or soda. But don't quit caffeine suddenly. That can also give you migraines.  ?? Do not smoke or allow others to smoke around you. If you need help quitting, talk to your doctor about stop-smoking programs and medicines. These can increase your chances of quitting for good.  ?? If you are taking  birth control pills or hormone therapy, talk to your doctor about whether they are triggering your migraines.  When should you call for help?  Call 911 anytime you think you may need emergency care. For example, call if:  ?? You have signs of a stroke. These may include:  ?? Sudden numbness, paralysis, or weakness in your face, arm, or leg, especially on only one side of your body.  ?? Sudden vision changes.  ?? Sudden trouble speaking.  ?? Sudden confusion or trouble understanding simple statements.  ?? Sudden problems with walking or balance.  ?? A sudden, severe headache that is different from past headaches.  Call your doctor now or seek immediate medical care if:  ?? You have new or worse nausea and vomiting.  ?? You have a new or higher fever.  ?? Your headache gets much worse.  Watch closely for changes in your health, and be sure to contact your doctor if:  ?? You are not getting better after 2 days (48 hours).  Where can you learn more?  Go to [email or web address] and sign in to your MyChart account. Enter U690 in the Search Health Information box to learn more about "Migraine Headache: Care Instructions."     If you do not have an account, please click on the "Sign Up Now" link.  Current as of: September 27, 2014  Content Version: 11.1  ?? 2006-2016 [employer]. Care instructions adapted under license by [facility]. If you have questions about a medical condition or this instruction, always ask your healthcare professional. [employer] disclaims any warranty or liability for your use of this information.

## 2015-10-14 ENCOUNTER — Encounter: Primary: Family

## 2015-10-16 ENCOUNTER — Encounter: Primary: Family

## 2015-10-22 ENCOUNTER — Emergency Department: Admit: 2015-10-23 | Primary: Family

## 2015-10-22 DIAGNOSIS — F411 Generalized anxiety disorder: Secondary | ICD-10-CM
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
## 2015-10-22 NOTE — ED Notes (Signed)
Bed: 024A  Expected date: 10/22/15  Expected time:   Means of arrival: [facility]  Comments:     Raye SorrowAnne Saskia Simerson, RN  10/22/15 [phone]
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
## 2015-10-23 ENCOUNTER — Inpatient Hospital Stay
Admit: 2015-10-23 | Discharge: 2015-10-23 | Disposition: A | Discharge status: Home / Self Care | Attending: Chiropractor

## 2015-10-23 LAB — URINE WITH REFLEXED MICRO
Bilirubin Urine: NEGATIVE
Blood, Urine: NEGATIVE
CASTS 2: NONE SEEN /lpf
Crystals, UA: NONE SEEN
Glucose, Ur: NEGATIVE mg/dl
Ketones, Urine: NEGATIVE
MISCELLANEOUS 2: NONE SEEN
Nitrite, Urine: NEGATIVE
Protein, UA: NEGATIVE
Renal Epithelial, UA: NONE SEEN
Specific Gravity, Urine: 1.016 (ref 1.002–1.03)
Urobilinogen, Urine: 0.2 eu/dl (ref 0.0–1.0)
Yeast, UA: NONE SEEN
pH, UA: 8 (ref 5.0–9.0)

## 2015-10-23 LAB — BASIC METABOLIC PANEL
BUN: 16 mg/dl (ref 7–22)
CO2: 20 meq/l — ABNORMAL LOW (ref 23–33)
Calcium: 8.9 mg/dl (ref 8.5–10.5)
Chloride: 103 meq/l (ref 98–111)
Creatinine: 0.8 mg/dl (ref 0.4–1.2)
Glucose: 101 mg/dl (ref 70–108)
Potassium: 3.8 meq/l (ref 3.5–5.2)
Sodium: 137 meq/l (ref 135–145)

## 2015-10-23 LAB — CBC WITH AUTO DIFFERENTIAL
Basophils Absolute: 0.1 10*3/uL (ref 0.0–0.1)
Basophils: 0.6 %
Eosinophils Absolute: 0.2 10*3/uL (ref 0.0–0.4)
Eosinophils: 1.9 %
Hematocrit: 39.3 % (ref 37.0–47.0)
Hemoglobin: 13.3 gm/dl (ref 12.0–16.0)
Lymphocytes Absolute: 3.5 10*3/uL (ref 1.0–4.8)
Lymphocytes: 30.9 %
MCH: 29 pg (ref 27.0–31.0)
MCHC: 33.8 gm/dl (ref 33.0–37.0)
MCV: 85.8 fL (ref 81.0–99.0)
MPV: 8.8 mcm (ref 7.4–10.4)
Monocytes Absolute: 0.6 10*3/uL (ref 0.4–1.3)
Monocytes: 5 %
Platelets: 265 10*3/uL (ref 130–400)
RBC Morphology: NORMAL
RBC: 4.58 10*6/uL (ref 4.20–5.40)
RDW: 14.2 % (ref 11.5–14.5)
Seg Neutrophils: 61.6 %
Segs Absolute: 7 10*3/uL (ref 1.8–7.7)
WBC: 11.4 10*3/uL — ABNORMAL HIGH (ref 4.8–10.8)
nRBC: 0 /100 wbc

## 2015-10-23 LAB — URINE DRUG SCREEN
Amphetamine+Methamphetamine Urine Screen: NEGATIVE
Barbiturate Quant, Ur: NEGATIVE
Benzodiazepine Quant, Ur: NEGATIVE
Cannabinoid Quant, Ur: NEGATIVE
Cocaine Metab Quant, Ur: NEGATIVE
Opiates, Urine: NEGATIVE
Oxycodone: NEGATIVE
PCP Quant, Ur: NEGATIVE

## 2015-10-23 LAB — HCG, SERUM, QUALITATIVE: Preg, Serum: NEGATIVE

## 2015-10-23 LAB — GLOMERULAR FILTRATION RATE, ESTIMATED: Est, Glom Filt Rate: 86 mL/min/{1.73_m2} — AB

## 2015-10-23 LAB — ETHANOL: ETHYL ALCOHOL, SERUM: <0.01 % (gm/dl)

## 2015-10-23 LAB — HEPATIC FUNCTION PANEL
ALT: 7 U/L — ABNORMAL LOW (ref 11–66)
AST: 9 U/L (ref 5–40)
Albumin: 3.9 gm/dl (ref 3.5–5.1)
Alkaline Phosphatase: 65 U/L (ref 38–126)
Bilirubin, Direct: <0.2 mg/dl (ref 0.0–0.3)
Total Bilirubin: <0.2 mg/dl — ABNORMAL LOW (ref 0.3–1.2)
Total Protein: 6.4 gm/dl (ref 6.1–8.0)

## 2015-10-23 LAB — MAGNESIUM: Magnesium: 2.2 mg/dl (ref 1.6–2.4)

## 2015-10-23 LAB — LIPASE: Lipase: 37.4 U/L (ref 5.6–51.3)

## 2015-10-23 LAB — OSMOLALITY: Osmolality Calc: 275.1 mOsmol/kg (ref >=275.0)

## 2015-10-23 LAB — TROPONIN: Troponin T: <0.01 ng/ml

## 2015-10-23 LAB — ANION GAP: Anion Gap: 14 meq/l (ref 8.0–16.0)

## 2015-10-23 LAB — TSH: TSH: 1.86 mcIU/ml (ref 0.400–4.20)

## 2015-10-23 MED ORDER — NITROFURANTOIN MONOHYD MACRO 100 MG PO CAPS
100 MG | ORAL_CAPSULE | Freq: Two times a day (BID) | ORAL | 0 refills | Status: AC
Start: 2015-10-23 — End: 2015-11-02

## 2015-10-23 MED ORDER — ALPRAZOLAM 0.25 MG PO TABS
0.25 MG | ORAL_TABLET | Freq: Every evening | ORAL | 0 refills | Status: DC | PRN
Start: 2015-10-23 — End: 2015-11-04

## 2015-10-23 NOTE — ED Notes (Signed)
Pt updated on labs. Mother at bedside. No needs. Call light in reach     Tivis Ringerhristine M Longbrake, RN  10/23/15 [phone]

## 2015-10-23 NOTE — ED Notes (Signed)
Pt up to the restroom with steady gait     Tivis RingerChristine M Longbrake, RN  10/23/15 0020

## 2015-10-24 LAB — URINE CULTURE, REFLEXED

## 2015-11-04 ENCOUNTER — Inpatient Hospital Stay: Admit: 2015-11-04 | Discharge: 2015-11-04 | Disposition: A | Discharge status: Home / Self Care

## 2015-11-04 ENCOUNTER — Emergency Department: Admit: 2015-11-04 | Primary: Family

## 2015-11-04 MED ORDER — NAPROXEN 500 MG PO TABS
500 MG | ORAL_TABLET | Freq: Two times a day (BID) | ORAL | 0 refills | Status: DC
Start: 2015-11-04 — End: 2016-03-19

## 2015-11-04 MED ORDER — IBUPROFEN 200 MG PO TABS
200 MG | Freq: Once | ORAL | Status: AC
Start: 2015-11-04 — End: 2015-11-04
  Administered 2015-11-04: 23:00:00 600 mg via ORAL

## 2015-11-04 MED ORDER — MELATONIN 3 MG PO TABS
3 MG | ORAL_TABLET | Freq: Every evening | ORAL | 0 refills | Status: DC | PRN
Start: 2015-11-04 — End: 2016-02-19

## 2015-11-04 MED FILL — IBU-200 200 MG PO TABS: 200 MG | ORAL | Qty: 3
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
## 2015-11-06 ENCOUNTER — Ambulatory Visit: Admit: 2015-11-06 | Discharge: 2015-11-06 | Payer: PRIVATE HEALTH INSURANCE | Attending: Family | Primary: Family

## 2015-11-06 DIAGNOSIS — T7491XA Unspecified adult maltreatment, confirmed, initial encounter: Secondary | ICD-10-CM

## 2015-11-06 MED ORDER — PREDNISONE 20 MG PO TABS
20 MG | ORAL_TABLET | Freq: Three times a day (TID) | ORAL | 0 refills | Status: AC
Start: 2015-11-06 — End: 2015-11-10

## 2015-11-06 MED ORDER — KETOROLAC TROMETHAMINE 10 MG PO TABS
10 MG | ORAL_TABLET | Freq: Four times a day (QID) | ORAL | 0 refills | Status: DC | PRN
Start: 2015-11-06 — End: 2016-02-19

## 2015-11-06 MED ORDER — HYDROXYZINE HCL 25 MG PO TABS
25 MG | ORAL_TABLET | Freq: Every evening | ORAL | 1 refills | Status: AC | PRN
Start: 2015-11-06 — End: 2015-11-16

## 2015-11-06 MED ORDER — TRAMADOL HCL 50 MG PO TABS
50 MG | ORAL_TABLET | Freq: Every evening | ORAL | 0 refills | Status: DC
Start: 2015-11-06 — End: 2016-02-19

## 2015-11-06 MED ORDER — SERTRALINE HCL 50 MG PO TABS
50 MG | ORAL_TABLET | Freq: Every day | ORAL | 1 refills | Status: DC
Start: 2015-11-06 — End: 2016-02-19
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
## 2015-11-06 NOTE — Progress Notes (Signed)
Visit Information    Have you changed or started any medications since your last visit including any over-the-counter medicines, vitamins, or herbal medicines? yes - see update medication list   Are you having any side effects from any of your medications? -  no  Have you stopped taking any of your medications? Is so, why? -  no    Have you seen any other physician or provider since your last visit? Yes - Records Obtained  Have you had any other diagnostic tests since your last visit? Yes - Records Obtained  Have you been seen in the emergency room and/or had an admission to a hospital since we last saw you? Yes - Records Obtained  Have you had your routine dental cleaning in the past 6 months? yes -      Have you activated your MyChart account? If not, what are your barriers? Yes     Patient Care Team:  Durwin RegesEric W Amstutz, NP as PCP - General (Certified Nurse Practitioner)    Medical History Review  Past Medical, Family, and Social History reviewed and does contribute to the patient presenting condition    Health Maintenance   Topic Date Due   ??? HIV screen  12/18/2003   ??? DTaP/Tdap/Td vaccine (1 - Tdap) 12/18/2007   ??? Cervical cancer screen  10/27/2014   ??? Flu vaccine (1) 03/09/2016 (Originally 03/10/2015)

## 2015-11-06 NOTE — Patient Instructions (Signed)
Shoulder Separation: Rehab Exercises  Your Care Instructions  Here are some examples of typical rehabilitation exercises for your condition. Start each exercise slowly. Ease off the exercise if you start to have pain.  Your doctor or physical therapist will tell you when you can start these exercises and which ones will work best for you.  How to do the exercises  Neck rotation    1. Sit in a firm chair, or stand up straight.  2. Keeping your chin level, turn your head to the right, and hold for 15 to 30 seconds.  3. Turn your head to the left, and hold for 15 to 30 seconds.  4. Repeat 2 to 4 times to each side.  Shoulder rolls    1. Sit comfortably with your feet shoulder-width apart. You can also do this exercise while standing.  2. Roll your shoulders up, then back, and then down in a smooth, circular motion.  3. Repeat 2 to 4 times.  Neck stretches    1. Look straight ahead, and tip your right ear to your right shoulder. Do not let your left shoulder rise as you tip your head to the right.  2. Hold for 15 to 30 seconds.  3. Tilt your head to the left. Do not let your right shoulder rise as you tip your head to the left.  4. Hold for 15 to 30 seconds.  5. Repeat 2 to 4 times to each side.  Shoulder blade squeeze    1. While standing with your arms at your sides, squeeze your shoulder blades together. Do not raise your shoulders as you are squeezing.  2. Hold 6 seconds.  3. Repeat 8 to 12 times.  Shoulder flexion (lying down)    Note: For this and the following exercises, you will need a wand. To make a wand, use a piece of PVC pipe or a broom handle with the broom removed. Make the wand about a foot wider than your shoulders.  1. Lie on your back, holding a wand with your hands. Your palms should face down as you hold the wand. Place your hands slightly wider than your shoulders.  2. Keeping your elbows straight, slowly raise your arms over your head until you feel a stretch in your shoulders, upper back,  and chest.  3. Hold 15 to 30 seconds.  4. Repeat 2 to 4 times.  Shoulder extension (standing)    1. Stand, and hold a wand in both hands behind your back. Place your hands wide enough apart on the wand so it is comfortable, about the same width as your shoulders. Your palms should face away from your body.  2. Move the wand back away from your body. Go as far as possible without pain.  3. Hold the stretch for about 6 seconds.  4. Repeat 8 to 12 times.  Goal post stretch    1. Lie on your back with your knees bent.  2. Hold a wand in your hands with your palms facing your knees. Rest your elbows on the floor, holding your hands about shoulder-width apart with the wand above your chest.  3. Move the wand back over your head as far as possible without pain. If you can, rest the wand on the floor as you hold the stretch.  4. Hold for 15 to 30 seconds.  5. Repeat 2 to 4 times.  Follow-up care is a key part of your treatment and safety. Be sure to make and   go to all appointments, and call your doctor if you are having problems. It's also a good idea to know your test results and keep a list of the medicines you take.  Where can you learn more?  Go to [email or web address] and sign in to your MyChart account. Enter N155 in the Search Health Information box to learn more about "Shoulder Separation: Rehab Exercises."     If you do not have an account, please click on the "Sign Up Now" link.  Current as of: Dec 30, 2014  Content Version: 11.2  ?? 2006-2017 [employer]. Care instructions adapted under license by [facility]. If you have questions about a medical condition or this instruction, always ask your healthcare professional. [employer], Inc[employer] disclaims any warranty or liability for your use of this information.

## 2015-11-27 ENCOUNTER — Encounter: Attending: Family | Primary: Family

## 2016-01-02 DIAGNOSIS — N760 Acute vaginitis: Secondary | ICD-10-CM

## 2016-01-02 NOTE — Other (Addendum)
Patient Acct Nbr:  [id number]  Primary AUTH/CERT:    Primary Insurance Company Name:   [employer]  Primary Insurance Plan Name:  [employer] B[employer]ILROAD MEDICA  Primary Insurance Group Number:    Primary Insurance Plan Type:   Primary Insurance Policy Number:  [id number]    Secondary AUTH/CERT:    Secondary Insurance Company Name:   MEDICAID  Secondary Insurance Plan Name:  MEDICAID 2ND  Secondary Insurance Group Number:    Secondary Insurance Plan Type:   Secondary Insurance Policy Number:  [id number]    Tertiary AUTH/CERT:    UAL Corporationertiary Insurance Company Name:   [employer] FEE [facility] [employer]  Tertiary Insurance Plan Name:  [employer] PR FEE  Tertiary Insurance Group Number:    [employer] Br[employer]ertiary Insurance Plan Type:   Network engineerTertiary Insurance Policy Number:  [id number]

## 2016-01-03 ENCOUNTER — Inpatient Hospital Stay: Admit: 2016-01-03 | Primary: Family

## 2016-01-03 MED ORDER — GADOVERSETAMIDE 330.9 MG/ML IV SOLN
330.9 MG/ML | Freq: Once | INTRAVENOUS | Status: AC | PRN
Start: 2016-01-03 — End: 2016-01-02
  Administered 2016-01-03: 03:00:00 10 mL via INTRAVENOUS

## 2016-01-07 NOTE — Telephone Encounter (Signed)
Pt. Was wondering what the results of her MRI was. Phone number confirmed.

## 2016-01-07 NOTE — Telephone Encounter (Signed)
Patient informed via result note.

## 2016-02-19 ENCOUNTER — Inpatient Hospital Stay
Admit: 2016-02-19 | Discharge: 2016-02-20 | Disposition: A | Discharge status: Home / Self Care | Attending: Family Medicine

## 2016-02-19 ENCOUNTER — Emergency Department: Admit: 2016-02-19 | Primary: Family

## 2016-02-19 DIAGNOSIS — S161XXA Strain of muscle, fascia and tendon at neck level, initial encounter: Secondary | ICD-10-CM

## 2016-02-19 LAB — PREGNANCY, URINE: Pregnancy, Urine: NEGATIVE

## 2016-02-19 LAB — MICROSCOPIC URINALYSIS
Bilirubin Urine: NEGATIVE
Casts: NONE SEEN /lpf
Casts: NONE SEEN /lpf
Crystals: NONE SEEN
Glucose, Urine: NEGATIVE mg/dl
Ketones, Urine: NEGATIVE
Miscellaneous Lab Test Result: NONE SEEN
Nitrite, Urine: NEGATIVE
Protein, UA: NEGATIVE mg/dl
Renal Epithelial, UA: NONE SEEN
Specific Gravity, UA: 1.011 (ref 1.002–1.03)
Urobilinogen, Urine: 0.2 eu/dl (ref 0.0–1.0)
Yeast, UA: NONE SEEN
pH, UA: 7.5 (ref 5.0–9.0)

## 2016-02-19 MED ORDER — ACETAMINOPHEN 325 MG PO TABS
325 MG | ORAL | Status: DC | PRN
Start: 2016-02-19 — End: 2016-02-19
  Administered 2016-02-19: 23:00:00 650 mg via ORAL

## 2016-02-19 MED ORDER — HYDROCODONE-ACETAMINOPHEN 5-325 MG PO TABS
5-325 MG | ORAL_TABLET | Freq: Every evening | ORAL | 0 refills | Status: AC | PRN
Start: 2016-02-19 — End: 2016-02-29

## 2016-02-19 MED FILL — ACETAMINOPHEN 325 MG PO TABS: 325 MG | ORAL | Qty: 2

## 2016-02-19 NOTE — ED Notes (Signed)
Pt refused soft collar neck brace.      Jennifer Emoryhelsea L Gerten, RN  02/19/16 2015
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
## 2016-02-19 NOTE — ED Notes (Signed)
Perrla noted     Tivis RingerChristine M Longbrake, RN  02/19/16 [phone]

## 2016-02-19 NOTE — ED Notes (Signed)
[facility]     Jermari Tamargo D Brendaliz Kuk, RN  02/19/16 1919
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
## 2016-02-19 NOTE — ED Notes (Signed)
Ice applied to affected area. Pt resting on cot. Respirations even and unlabored. Family is bringing in food for pt. Will continue to monitor.      Deretha Emoryhelsea L Gerten, RN  02/19/16 1928

## 2016-02-19 NOTE — Other (Addendum)
Patient Acct Nbr:  [id number]  Primary AUTH/CERT:    Primary Insurance Company Name:   [employer] [facility]  Primary Insurance Plan Name:  AUTO/LIABILITY  Primary Insurance Group Number:    Primary Insurance Plan Type:   Primary Insurance Policy Number:  [id number]    Secondary AUTH/CERT:    Secondary Insurance Company Name:   [employer] [facility]  Secondary Insurance Plan Name:  AUTO/LIAB PROF  Secondary Insurance Group Number:    Secondary Insurance Plan Type:   Secondary Insurance Policy Number:  [id number]

## 2016-03-18 ENCOUNTER — Encounter: Payer: PRIVATE HEALTH INSURANCE | Attending: Family | Primary: Family

## 2016-03-19 ENCOUNTER — Ambulatory Visit: Admit: 2016-03-19 | Discharge: 2016-03-19 | Payer: PRIVATE HEALTH INSURANCE | Attending: Family | Primary: Family

## 2016-03-19 DIAGNOSIS — L501 Idiopathic urticaria: Secondary | ICD-10-CM

## 2016-03-19 MED ORDER — CETIRIZINE HCL 10 MG PO CAPS
10 MG | ORAL_CAPSULE | ORAL | 2 refills | Status: AC
Start: 2016-03-19 — End: ?

## 2016-03-19 MED ORDER — BUTALBITAL-APAP-CAFFEINE 50-325-40 MG PO TABS
50-325-40 MG | ORAL_TABLET | Freq: Four times a day (QID) | ORAL | 0 refills | Status: DC | PRN
Start: 2016-03-19 — End: 2016-11-23

## 2016-03-19 NOTE — Patient Instructions (Signed)
You may receive a survey about your visit with us today. The feedback from our patients helps us identify what is working well and where the service to all patients can be enhanced. Thank you!

## 2016-03-19 NOTE — Progress Notes (Signed)
Visit Information    Have you changed or started any medications since your last visit including any over-the-counter medicines, vitamins, or herbal medicines? yes - med list addressed   Have you stopped taking any of your medications? Is so, why? -  no  Are you having any side effects from any of your medications? - no    Have you seen any other physician or provider since your last visit?  yes - Dr Jason NestNockowitz   Have you had any other diagnostic tests since your last visit?  yes -  MRI of brain    Have you been seen in the emergency room and/or had an admission in a hospital since we last saw you?  [facility]   Have you had your routine dental cleaning in the past 6 months?  no     Do you have an active MyChart account? If no, what is the barrier?  Yes    Patient Care Team:  Durwin RegesEric W Amstutz, NP as PCP - General (Certified Nurse Practitioner)    Medical History Review  Past Medical, Family, and Social History reviewed and does not contribute to the patient presenting condition    Health Maintenance   Topic Date Due   ??? HIV screen  12/18/2003   ??? DTaP/Tdap/Td vaccine (1 - Tdap) 12/18/2007   ??? Cervical cancer screen  10/27/2014   ??? Flu vaccine (1) 03/09/2016
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
## 2016-04-19 ENCOUNTER — Encounter: Payer: PRIVATE HEALTH INSURANCE | Attending: Family | Primary: Family

## 2016-04-28 ENCOUNTER — Telehealth

## 2016-04-28 MED ORDER — VENLAFAXINE HCL ER 75 MG PO CP24
75 | ORAL_CAPSULE | Freq: Every morning | ORAL | 0 refills | Status: DC
Start: 2016-04-28 — End: 2016-11-23

## 2016-04-28 MED ORDER — OLANZAPINE 10 MG PO TBDP
10 | ORAL_TABLET | Freq: Every evening | ORAL | 0 refills | Status: DC
Start: 2016-04-28 — End: 2016-04-29

## 2016-04-28 NOTE — Telephone Encounter (Signed)
Patient states that she cannot get into her psychiatrist until 05/10/16. She states that her psychiatrist recommended that her pcp fill her meds until then.  She states that she is experiencing an altered reality complete with hallucinations.      olanzipine 5mg  qhs  Venlafaxine 75mg  qd    [facility]  DOLV: 03/19/16  DONV: 05/04/16

## 2016-04-28 NOTE — Telephone Encounter (Signed)
Called pt and informed her new rx sent to pharm  She voiced understanding

## 2016-04-29 ENCOUNTER — Telehealth

## 2016-04-29 MED ORDER — OLANZAPINE 10 MG PO TABS
10 MG | ORAL_TABLET | Freq: Every evening | ORAL | 0 refills | Status: DC
Start: 2016-04-29 — End: 2016-10-18

## 2016-04-29 NOTE — Telephone Encounter (Signed)
recived notice from [facility] that Jennifer Burch does not need P/A  called R/A and they suggested that since it is a disintegrating tab may be reason for P/A  Regular tab does not need P/A but disintegrating tab does   Called pt to see if she can take reg tab  No answer and no mail box

## 2016-05-04 ENCOUNTER — Ambulatory Visit: Admit: 2016-05-04 | Discharge: 2016-05-04 | Payer: PRIVATE HEALTH INSURANCE | Attending: Family | Primary: Family

## 2016-05-04 DIAGNOSIS — F4310 Post-traumatic stress disorder, unspecified: Secondary | ICD-10-CM

## 2016-05-04 NOTE — Progress Notes (Signed)
Visit Information    Have you changed or started any medications since your last visit including any over-the-counter medicines, vitamins, or herbal medicines? no   Are you having any side effects from any of your medications? -  no  Have you stopped taking any of your medications? Is so, why? -  no    Have you seen any other physician or provider since your last visit? No  Have you had any other diagnostic tests since your last visit? No  Have you been seen in the emergency room and/or had an admission to a hospital since we last saw you? No  Have you had your routine dental cleaning in the past 6 months? no    Have you activated your MyChart account? If not, what are your barriers? Yes     Patient Care Team:  Durwin RegesEric W Amstutz, NP as PCP - General (Certified Nurse Practitioner)    Medical History Review  Past Medical, Family, and Social History reviewed and does contribute to the patient presenting condition    Health Maintenance   Topic Date Due   ??? HIV screen  12/18/2003   ??? DTaP/Tdap/Td vaccine (1 - Tdap) 12/18/2007   ??? Cervical cancer screen  10/27/2014   ??? Flu vaccine (1) 04/09/2016
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
## 2016-05-04 NOTE — Patient Instructions (Signed)
You may receive a survey about your visit with us today. The feedback from our patients helps us identify what is working well and where the service to all patients can be enhanced. Thank you!

## 2016-07-28 NOTE — Telephone Encounter (Signed)
Attempted to notify patient, no answer and vm is full

## 2016-07-28 NOTE — Telephone Encounter (Signed)
doesn't have appt with psych till Jan  will you reill Clonazepam  If no call will check R/A in [facility]

## 2016-07-28 NOTE — Telephone Encounter (Signed)
She needs to get this prescription from Dr. Lubertha SouthKnockowitz due to controlled substance.  That is who prescribed in past on [facility] and that is who is has appointment with in January.

## 2016-07-28 NOTE — Telephone Encounter (Signed)
Patient has an appointment scheduled with [facility]. I did google search and called to verify appointment. Unable to speak directly with anyone, goes directly to vm.

## 2016-07-28 NOTE — Telephone Encounter (Signed)
Who is she seeing?

## 2016-07-29 ENCOUNTER — Encounter: Attending: Family | Primary: Family

## 2016-07-29 NOTE — Telephone Encounter (Signed)
Patient notified, understanding voiced.

## 2016-10-11 ENCOUNTER — Inpatient Hospital Stay
Admit: 2016-10-11 | Discharge: 2016-10-11 | Disposition: A | Discharge status: Home / Self Care | Payer: PRIVATE HEALTH INSURANCE

## 2016-10-11 DIAGNOSIS — L03211 Cellulitis of face: Secondary | ICD-10-CM

## 2016-10-11 MED ORDER — CEPHALEXIN 500 MG PO CAPS
500 MG | ORAL_CAPSULE | Freq: Four times a day (QID) | ORAL | 0 refills | Status: AC
Start: 2016-10-11 — End: 2016-10-21

## 2016-10-11 MED ORDER — PREDNISONE 20 MG PO TABS
20 MG | ORAL_TABLET | Freq: Two times a day (BID) | ORAL | 0 refills | Status: AC
Start: 2016-10-11 — End: 2016-10-16

## 2016-10-11 NOTE — ED Provider Notes (Signed)
[facility]  Urgent Care Encounter       CHIEF COMPLAINT       Chief Complaint   Patient presents with   . Facial Swelling     eyes       Nurses Notes reviewed and I agree except as noted in the HPI.  HISTORY OF PRESENT ILLNESS   Jennifer Burch is a 26 y.o. female who presents To the urgent care center in the facial swelling around the eyes.  The patient states that she used a charcoal cleanser on her face on Friday.  She stated that she did help some burning but noticed over the past 2 days that she started to have redness and swelling around the eyes.  She has a dry scaly scab noted on both cheeks but denies any visual changes.  Patient also denies any fever or chills.    The history is provided by the patient. No language interpreter was used.   Rash   Location:  Face  Facial rash location:  L cheek and R cheek  Quality: burning, dryness, painful, peeling, redness and swelling    Quality: not blistering, not draining and not itchy    Pain details:     Quality:  Burning and sore    Severity:  Moderate    Onset quality:  Sudden    Timing:  Constant    Progression:  Worsening  Severity:  Moderate  Onset quality:  Sudden  Duration:  2 days  Timing:  Constant  Progression:  Worsening  Chronicity:  New  Context: chemical exposure    Context comment:  Charcoal facial wash  Relieved by:  None tried  Worsened by:  Nothing  Ineffective treatments:  Antihistamines  Associated symptoms: no fever    Associated symptoms comment:  None      REVIEW OF SYSTEMS     Review of Systems   Constitutional: Negative for activity change, chills and fever.   Eyes: Negative for photophobia, pain, discharge, redness and itching.   Skin: Positive for rash.        Bilateral cheeks       PAST MEDICAL HISTORY         Diagnosis Date   . Headache    . Schizoaffective disorder, bipolar type [facility])        SURGICAL HISTORY     Patient  has no past surgical history on file.    CURRENT MEDICATIONS       Discharge Medication List as  of 10/11/2016  2:19 PM      CONTINUE these medications which have NOT CHANGED    Details   diphenhydrAMINE (BENADRYL) 25 MG capsule Take 25 mg by mouth every 6 hours as needed for ItchingHistorical Med      topiramate (TOPAMAX) 50 MG tablet Take 50 mg by mouth 2 times dailyHistorical Med      OLANZapine (ZYPREXA) 10 MG tablet Take 1 tablet by mouth nightly, Disp-30 tablet, R-0Normal      venlafaxine (EFFEXOR XR) 75 MG extended release capsule Take 1 capsule by mouth every morning, Disp-30 capsule, R-0Normal      clonazePAM (KLONOPIN) 0.5 MG tablet Take 0.5 mg by mouth 3 times dailyHistorical Med      Cetirizine HCl (ZYRTEC ALLERGY) 10 MG CAPS Take 1 cap BID, Disp-60 capsule, R-2Normal      butalbital-acetaminophen-caffeine (FIORICET, ESGIC) 50-325-40 MG per tablet Take 1 tablet by mouth every 6 hours as needed for Headaches, Disp-15 tablet,  R-0Normal             ALLERGIES     Patient is is allergic to tape [adhesive tape].    Patients   There is no immunization history on file for this patient.    FAMILY HISTORY     Patient's family history includes Diabetes in her maternal aunt; Heart Disease in her maternal grandmother.    SOCIAL HISTORY     Patient  reports that she has never smoked. She has never used smokeless tobacco. She reports that she does not drink alcohol or use drugs.    PHYSICAL EXAM     ED TRIAGE VITALS  BP: 107/62, Temp: 98.9 F (37.2 C), Pulse: 109, Resp: 16, SpO2: 100 %,Estimated body mass index is 23.47 kg/m as calculated from the following:    Height as of this encounter: 5\' 6"  (1.676 m).    Weight as of this encounter: 145 lb 6.4 oz (66 kg).,Patient's last menstrual period was 10/04/2016.    Physical Exam   Constitutional: She is oriented to person, place, and time. Vital signs are normal. She appears well-developed and well-nourished. She is cooperative.  Non-toxic appearance. She does not have a sickly appearance. She does not appear ill. No distress.   HENT:   Head: Normocephalic.   Right  Ear: External ear normal.   Left Ear: External ear normal.   Nose: Nose normal.   Neck: Normal range of motion.   Cardiovascular: Normal rate.    Pulmonary/Chest: Effort normal.   Neurological: She is alert and oriented to person, place, and time.   Skin: Skin is dry. She is not diaphoretic. There is erythema.        Bilateral cheeks   Nursing note and vitals reviewed.            DIAGNOSTIC RESULTS   Labs:No results found for this visit on 10/11/16.    IMAGING:    No orders to display         EKG:      URGENT CARE COURSE:     Vitals:    10/11/16 1356   BP: 107/62   Pulse: 109   Resp: 16   Temp: 98.9 F (37.2 C)   TempSrc: Oral   SpO2: 100%   Weight: 145 lb 6.4 oz (66 kg)   Height: 5\' 6"  (1.676 m)       Medications - No data to display    ED Course        PROCEDURES:  None    FINAL IMPRESSION      1. Cellulitis of face          DISPOSITION/PLAN   DISPOSITION Decision To Discharge 10/11/2016 02:13:29 PM    Monitor for redness, drainage, pain   Monitor for any fevers  Keep clean and dry  Take medication as directed  Follow up with your PCP or return for any concerns   or go to the Emergency Department    PATIENT REFERRED TO:  Durwin Reges, NP  [address] [place], [place] [address]  [place] [address]  [phone]    In 1 week  For recheck and further evaluation and management, If symptoms worsen go to the Emergency Department      DISCHARGE MEDICATIONS:  Discharge Medication List as of 10/11/2016  2:19 PM      START taking these medications    Details   cephALEXin (KEFLEX) 500 MG capsule Take 1 capsule by mouth 4 times daily for  10 days, Disp-40 capsule, R-0Normal      predniSONE (DELTASONE) 20 MG tablet Take 1 tablet by mouth 2 times daily for 5 days, Disp-10 tablet, R-0Normal             Discharge Medication List as of 10/11/2016  2:19 PM          Discharge Medication List as of 10/11/2016  2:19 PM          Marcell Anger, NP    (Please note that portions of this note were completed with a voice recognition program.  Efforts were  made to edit the dictations but occasionally words are mis-transcribed.)           Marcell Anger, NP  10/11/16 1443

## 2016-10-11 NOTE — ED Triage Notes (Signed)
Patient ambulated to room with complaint of facial swelling around eyes. Patient states her eyes became swelling for past 2 days. Patient states she used a soap and water cleanser followed by a charcoal mask that has she has used in the past but noticed burning so she rinsed it off. After this she noticed redness and when she woke the next morning she had swelling and clear drainage. Today she began having scaly skin under eyes with increased swelling.

## 2016-10-18 ENCOUNTER — Inpatient Hospital Stay
Admit: 2016-10-18 | Discharge: 2016-10-18 | Disposition: A | Discharge status: Home / Self Care | Payer: PRIVATE HEALTH INSURANCE

## 2016-10-18 DIAGNOSIS — T476X1A Poisoning by antidiarrheal drugs, accidental (unintentional), initial encounter: Secondary | ICD-10-CM

## 2016-10-18 MED ORDER — ERYTHROMYCIN 5 MG/GM OP OINT
5 MG/GM | Freq: Four times a day (QID) | OPHTHALMIC | 0 refills | Status: AC
Start: 2016-10-18 — End: 2016-10-25

## 2016-10-18 MED ORDER — TETRACAINE HCL 0.5 % OP SOLN
0.5 % | Freq: Once | OPHTHALMIC | Status: AC
Start: 2016-10-18 — End: 2016-10-18
  Administered 2016-10-18: 19:00:00 1 [drp] via OPHTHALMIC

## 2016-10-18 MED ORDER — METHYLPREDNISOLONE SODIUM SUCC 125 MG IJ SOLR
125 MG | Freq: Once | INTRAMUSCULAR | Status: AC
Start: 2016-10-18 — End: 2016-10-18
  Administered 2016-10-18: 19:00:00 125 mg via INTRAMUSCULAR

## 2016-10-18 MED ORDER — PREDNISONE 20 MG PO TABS
20 MG | ORAL_TABLET | ORAL | 0 refills | Status: DC
Start: 2016-10-18 — End: 2016-11-23

## 2016-10-18 MED FILL — TETRACAINE HCL 0.5 % OP SOLN: 0.5 % | OPHTHALMIC | Qty: 4

## 2016-10-18 MED FILL — SOLU-MEDROL 125 MG IJ SOLR: 125 MG | INTRAMUSCULAR | Qty: 125

## 2016-10-18 NOTE — ED Provider Notes (Signed)
[facility]  Urgent Care Encounter      CHIEF COMPLAINT       Chief Complaint   Patient presents with   . Rash     due to allergic reaction       Nurses Notes reviewed and I agree except as noted in the HPI.  HISTORY OF PRESENT ILLNESS   CATON WOHLWEND is a 26 y.o. female who presents to the urgent care for reevaluation of facial cellulitis that was originally diagnosed and treated on 10/11/16 seen here in the urgent care center. She reports several days prior to that visit she had used a new charcoal mask, and had an allergic reaction. Patient reports that she is taking her medications, Keflex and prednisone, as directed and has also been applying Neosporin to the areas, but that has been leaking and getting into her eyes causing her irritation.     REVIEW OF SYSTEMS     Review of Systems   Constitutional: Negative.    HENT: Positive for facial swelling.    Eyes: Positive for redness and itching. Negative for pain and visual disturbance.   Cardiovascular: Negative.    Gastrointestinal: Negative.    Skin: Positive for wound (cellulitis of the face).   Allergic/Immunologic: Negative for environmental allergies and food allergies.   Neurological: Negative for headaches.       PAST MEDICAL HISTORY         Diagnosis Date   . Headache    . Schizoaffective disorder, bipolar type [facility])        SURGICAL HISTORY     Patient  has no past surgical history on file.    CURRENT MEDICATIONS       Discharge Medication List as of 10/18/2016  4:24 PM      CONTINUE these medications which have NOT CHANGED    Details   topiramate (TOPAMAX) 50 MG tablet Take 50 mg by mouth 2 times dailyHistorical Med      cephALEXin (KEFLEX) 500 MG capsule Take 1 capsule by mouth 4 times daily for 10 days, Disp-40 capsule, R-0Normal      venlafaxine (EFFEXOR XR) 75 MG extended release capsule Take 1 capsule by mouth every morning, Disp-30 capsule, R-0Normal      clonazePAM (KLONOPIN) 0.5 MG tablet Take 0.5 mg by mouth 3 times dailyHistorical  Med      diphenhydrAMINE (BENADRYL) 25 MG capsule Take 25 mg by mouth every 6 hours as needed for ItchingHistorical Med      Cetirizine HCl (ZYRTEC ALLERGY) 10 MG CAPS Take 1 cap BID, Disp-60 capsule, R-2Normal      butalbital-acetaminophen-caffeine (FIORICET, ESGIC) 50-325-40 MG per tablet Take 1 tablet by mouth every 6 hours as needed for Headaches, Disp-15 tablet, R-0Normal             ALLERGIES     Patient is is allergic to tape [adhesive tape].    FAMILY HISTORY     Patient's family history includes Diabetes in her maternal aunt; Heart Disease in her maternal grandmother.    SOCIAL HISTORY     Patient  reports that she has never smoked. She has never used smokeless tobacco. She reports that she does not drink alcohol or use drugs.    PHYSICAL EXAM     ED TRIAGE VITALS  BP: 101/70, Temp: 98.9 F (37.2 C), Pulse: 104, Resp: 20, SpO2: 96 %  Physical Exam   Constitutional: She is oriented to person, place, and time. Vital signs are  normal. She appears well-developed and well-nourished. She is active. She does not appear ill. No distress.   HENT:   Head: Normocephalic.   Right Ear: External ear normal.   Left Ear: External ear normal.   Mouth/Throat: Mucous membranes are normal.   Eyes: Conjunctivae are normal.   Neck: Normal range of motion.   Cardiovascular: Tachycardia present.    Pulmonary/Chest: Effort normal. No accessory muscle usage. No respiratory distress.   Neurological: She is alert and oriented to person, place, and time.   Skin: She is not diaphoretic. There is erythema.   Cellulitis to bilateral cheeks.        DIAGNOSTIC RESULTS   Labs:No results found for this visit on 10/18/16.    IMAGING:  No orders to display     URGENT CARE COURSE:     Vitals:    10/18/16 1336   BP: 101/70   Pulse: 104   Resp: 20   Temp: 98.9 F (37.2 C)   TempSrc: Oral   SpO2: 96%   Weight: 141 lb (64 kg)     PROCEDURES:  None  FINAL IMPRESSION      1. Allergic contact dermatitis due to chemical        DISPOSITION/PLAN    DISPOSITION   Transfer     PATIENT REFERRED TO:        Wynonia Sours, CNP         Wynonia Sours, CNP  10/18/16 1722

## 2016-10-18 NOTE — ED Triage Notes (Signed)
Pt to urgent care with continued complaints of a rash on her face due to an allergic reaction to a charcoal facial scrub she used last Friday.

## 2016-10-18 NOTE — ED Provider Notes (Signed)
[facility]  eMERGENCY dEPARTMENT eNCOUnter          CHIEF COMPLAINT       Chief Complaint   Patient presents with   ??? Rash     due to allergic reaction       Nurses Notes reviewed and I agree except as noted in the HPI.    HISTORY OF PRESENT ILLNESS    Jennifer Burch is a 26 y.o. female who presents to the Emergency Department for evaluation of a rash. The patient reports that the rash began approximately 10 days ago after excessively exfoliating her face and then using Biore charcoal facial wash which she has been using for over a year. The rash is located on her bilateral cheeks extending from just below her eyes to down to the level of her nose. Patient complains of pain and itching secondary to the rash. Her pain is described as constant burning that she currently rates as an 8/10 in severity and states worsens with palpation of the affected area. She also complains of weeping from the rash as well as eye redness and irritation from the weeping getting into her eyes. She reports this was only occurring while using neosporin, and the weeping has improved since stopping the neosporin in the past 2 days.  The patient denies any fever, chills, trouble swallowing, shortness of breath, wheezing, cough, chest pain, nausea, or vomiting. Patient is otherwise asymptomatic at this time. She was initially seen at urgent care one week ago where she was given a prescription for Keflex and steroids. The patient states that she had improvement in her symptoms with these medications until the steroids ran out, then her symptoms worsened. No further complaints at initial time of encounter.     The HPI was provided by the patient.      REVIEW OF SYSTEMS     Review of Systems   Constitutional: Negative for activity change, appetite change, chills, diaphoresis, fatigue and fever.   HENT: Negative for congestion, ear pain, rhinorrhea, sore throat and trouble swallowing.    Respiratory: Negative for cough, chest  tightness, shortness of breath and wheezing.    Cardiovascular: Negative for chest pain.   Gastrointestinal: Positive for diarrhea. Negative for abdominal pain, constipation, nausea and vomiting.   Skin: Positive for rash. Negative for color change and pallor.   Allergic/Immunologic: Negative for immunocompromised state.   Neurological: Negative for weakness.   Hematological: Does not bruise/bleed easily.      PAST MEDICAL HISTORY    has a past medical history of Headache and Schizoaffective disorder, bipolar type (HCC).    SURGICAL HISTORY      has no past surgical history on file.    CURRENT MEDICATIONS       Discharge Medication List as of 10/18/2016  4:24 PM      CONTINUE these medications which have NOT CHANGED    Details   topiramate (TOPAMAX) 50 MG tablet Take 50 mg by mouth 2 times dailyHistorical Med      cephALEXin (KEFLEX) 500 MG capsule Take 1 capsule by mouth 4 times daily for 10 days, Disp-40 capsule, R-0Normal      venlafaxine (EFFEXOR XR) 75 MG extended release capsule Take 1 capsule by mouth every morning, Disp-30 capsule, R-0Normal      clonazePAM (KLONOPIN) 0.5 MG tablet Take 0.5 mg by mouth 3 times dailyHistorical Med      diphenhydrAMINE (BENADRYL) 25 MG capsule Take 25 mg by mouth every 6  hours as needed for ItchingHistorical Med      Cetirizine HCl (ZYRTEC ALLERGY) 10 MG CAPS Take 1 cap BID, Disp-60 capsule, R-2Normal      butalbital-acetaminophen-caffeine (FIORICET, ESGIC) 50-325-40 MG per tablet Take 1 tablet by mouth every 6 hours as needed for Headaches, Disp-15 tablet, R-0Normal             ALLERGIES     is allergic to tape [adhesive tape].    FAMILY HISTORY     indicated that her mother is alive. She indicated that her father is alive. She indicated that the status of her maternal grandmother is unknown. She indicated that the status of her maternal aunt is unknown.    family history includes Diabetes in her maternal aunt; Heart Disease in her maternal grandmother.    SOCIAL HISTORY       reports that she has never smoked. She has never used smokeless tobacco. She reports that she does not drink alcohol or use drugs.    PHYSICAL EXAM     INITIAL VITALS:  weight is 141 lb (64 kg). Her oral temperature is 98.9 ??F (37.2 ??C). Her blood pressure is 101/70 and her pulse is 104. Her respiration is 20 and oxygen saturation is 96%.    Physical Exam   Constitutional: She is oriented to person, place, and time. She appears well-developed and well-nourished.   HENT:   Head: Normocephalic and atraumatic.   Right Ear: External ear normal.   Left Ear: External ear normal.   Nose: Nose normal.   Mouth/Throat: Oropharynx is clear and moist. No oropharyngeal exudate.   Cheeks and inferior periorbital regions are erythematous with crusting and warmth. There is no streaking or drainage from the areas of erythema   Eyes: EOM are normal. Pupils are equal, round, and reactive to light. Right eye exhibits no discharge. Left eye exhibits no discharge. Right conjunctiva is injected. Left conjunctiva is injected. No scleral icterus.   Fundoscopic exam:       The left eye shows no papilledema.   Slit lamp exam:       The right eye shows no corneal abrasion, no corneal ulcer, no foreign body and no fluorescein uptake.        The left eye shows no corneal abrasion, no corneal ulcer, no foreign body and no fluorescein uptake.   Bilateral sclera are injected   Neck: Normal range of motion. Neck supple. No JVD present.   Cardiovascular: Normal rate and regular rhythm.    Pulmonary/Chest: Effort normal and breath sounds normal. No stridor. No respiratory distress. She has no wheezes.   Abdominal: Soft. She exhibits no distension. There is no tenderness.   Musculoskeletal: Normal range of motion. She exhibits no edema or deformity.   Neurological: She is alert and oriented to person, place, and time. No cranial nerve deficit. She exhibits normal muscle tone. Coordination normal. GCS eye subscore is 4. GCS verbal subscore is 5. GCS  motor subscore is 6.   Skin: Skin is warm and dry. Rash (facial) noted. She is not diaphoretic. No erythema.   Psychiatric: She has a normal mood and affect. Her behavior is normal.   Nursing note and vitals reviewed.     DIFFERENTIAL DIAGNOSIS:   Considered chemical burn, allergic contact dermatitis, cellulitis    DIAGNOSTIC RESULTS     EKG: All EKG's are interpreted by the Emergency Department Physician who either signs or Co-signs this chart in the absence of a cardiologist.  None  RADIOLOGY: non-plain film images(s) such as CT, Ultrasound and MRI are read by the radiologist.  None    LABS:     Labs Reviewed - No data to display    EMERGENCY DEPARTMENT COURSE:   Vitals:    Vitals:    10/18/16 1336   BP: 101/70   Pulse: 104   Resp: 20   Temp: 98.9 ??F (37.2 ??C)   TempSrc: Oral   SpO2: 96%   Weight: 141 lb (64 kg)       2:56 PM: The patient was seen and evaluated.     Patient was seen history and physical exam was performed. Patient remained stable here in the emergency department. This patient was evaluated by myself.  Patient was resting on cart during examination. Patient did not appear in any distress.  On exam, the patient's cheeks and inferior periorbital regions are erythematous with crusting and warmth. There is no streaking or drainage from the areas of erythema. Bilateral sclera and conjunctiva are injected. Pupils are equal, round, and reactive to light and accomodation. EOMs are intact. Eyes were stained with fluorescein without noted uptake. Patient was given Solu-Medrol and Tetracaine while in the department.  Patient verbalized some improvement of symptoms with these medications. Repeat physical examination remained benign. I discussed the case with Dr Darlin PriestlyPatricio, who advised a longer course of steroids. The patient will be sent home with Prednisone and Romycin ointment. The patient was comfortable with the plan of discharge home and to follow up with dermatology and her PCP. Anticipatory guidance was  given. The patient is instructed to return to the emergency department for any worsening of their symptoms. Patient was discharged from the emergency department in good condition with all questions answered. See disposition below    CRITICAL CARE:   None.     CONSULTS:  Dr. Darlin PriestlyPatricio- ED attending; does not feel that dermatology consult is necessary at this time, though he believes referral is appropriate    PROCEDURES:  None    FINAL IMPRESSION      1. Allergic contact dermatitis due to chemical          DISPOSITION/PLAN     Discharge    PATIENT REFERRED TO:  Kathrynn SpeedMichael R Heaphy, MD  [address]  [address]  [place] [id number]  [phone]    Schedule an appointment as soon as possible for a visit in 2 days      [facility]  [address]  [place] [id number]  [phone]    If symptoms worsen      DISCHARGE MEDICATIONS:  Discharge Medication List as of 10/18/2016  4:24 PM      START taking these medications    Details   predniSONE (DELTASONE) 20 MG tablet Take 3 tabs daily by mouth for 3 days, then 2 tabs daily by mouth for 3 days, then one tab daily by mouth for 3 days, Disp-18 tablet, R-0Print      erythromycin (ROMYCIN) 5 MG/GM ophthalmic ointment Place into both eyes every 6 hours for 7 days Nightly., Both Eyes, EVERY 6 HOURS Starting Mon 10/18/2016, Until Mon 10/25/2016, 28 doses, Disp-3.5 g, R-0, Print             (Please note that portions of this note were completed with a voice recognition program.  Efforts were made to edit the dictations but occasionally words are mis-transcribed.)    This patient was seen independently by Jaclyn ShaggyErica Dilyn Osoria, PA-C a Mid-Level  Provider in the [facility] Emergency Department.  The patient was given an opportunity to see the Emergency Attending. The patient voiced understanding that I was a Corporate treasurer and was in agreement with being seen independently by myself.    Scribe:  Angola Berghuis 10/18/16 2:56 PM Scribing for and in the presence of [employer],  [employer].    Signed by: Angola Berghuis, Scribe, 10/18/16 6:19 PM    Provider:  I personally performed the services described in the documentation, reviewed and edited the documentation which was dictated to the scribe in my presence, and it accurately records my words and actions.    Jaclyn Shaggy, PA-C 10/18/16 6:19 PM                    Jaclyn Shaggy, PA  10/19/16 2300

## 2016-11-17 ENCOUNTER — Emergency Department: Admit: 2016-11-17 | Payer: PRIVATE HEALTH INSURANCE | Primary: Family

## 2016-11-17 ENCOUNTER — Inpatient Hospital Stay
Admit: 2016-11-17 | Discharge: 2016-11-17 | Disposition: A | Discharge status: Home / Self Care | Payer: PRIVATE HEALTH INSURANCE | Attending: Chiropractor

## 2016-11-17 DIAGNOSIS — G43109 Migraine with aura, not intractable, without status migrainosus: Secondary | ICD-10-CM

## 2016-11-17 LAB — ACETAMINOPHEN LEVEL: Acetaminophen Level: <5 ug/mL (ref 0.0–20.0)

## 2016-11-17 LAB — CBC WITH AUTO DIFFERENTIAL
Basophils Absolute: 0 10*3/uL (ref 0.0–0.1)
Basophils: 0.3 %
Eosinophils Absolute: 0.2 10*3/uL (ref 0.0–0.4)
Eosinophils: 2.9 %
Hematocrit: 37 % (ref 37.0–47.0)
Hemoglobin: 12.8 gm/dl (ref 12.0–16.0)
Lymphocytes Absolute: 2 10*3/uL (ref 1.0–4.8)
Lymphocytes: 32.7 %
MCH: 29.4 pg (ref 27.0–31.0)
MCHC: 34.7 gm/dl (ref 33.0–37.0)
MCV: 84.8 fL (ref 81.0–99.0)
MPV: 9.9 fL (ref 7.4–10.4)
Monocytes Absolute: 0.5 10*3/uL (ref 0.4–1.3)
Monocytes: 8.5 %
Platelets: 216 10*3/uL (ref 130–400)
RBC: 4.36 10*6/uL (ref 4.20–5.40)
RDW: 13.8 % (ref 11.5–14.5)
Seg Neutrophils: 55.6 %
Segs Absolute: 3.4 10*3/uL (ref 1.8–7.7)
WBC: 6.1 10*3/uL (ref 4.8–10.8)
nRBC: 0 /100 wbc

## 2016-11-17 LAB — URINE WITH REFLEXED MICRO
Bilirubin Urine: NEGATIVE
Blood, Urine: NEGATIVE
CASTS 2: NONE SEEN /lpf
Crystals, UA: NONE SEEN
Glucose, Ur: NEGATIVE mg/dl
Ketones, Urine: 40 — AB
MISCELLANEOUS 2: NONE SEEN
Nitrite, Urine: NEGATIVE
Protein, UA: NEGATIVE
Renal Epithelial, UA: NONE SEEN
Specific Gravity, Urine: 1.024 (ref 1.002–1.03)
Urobilinogen, Urine: 1 eu/dl (ref 0.0–1.0)
Yeast, UA: NONE SEEN
pH, UA: 7 (ref 5.0–9.0)

## 2016-11-17 LAB — HEPATIC FUNCTION PANEL
ALT: 15 U/L (ref 11–66)
AST: 14 U/L (ref 5–40)
Albumin: 4.4 g/dL (ref 3.5–5.1)
Alkaline Phosphatase: 51 U/L (ref 38–126)
Bilirubin, Direct: <0.2 mg/dL (ref 0.0–0.3)
Total Bilirubin: 0.4 mg/dL (ref 0.3–1.2)
Total Protein: 6.9 g/dL (ref 6.1–8.0)

## 2016-11-17 LAB — MAGNESIUM: Magnesium: 2.2 mg/dL (ref 1.6–2.4)

## 2016-11-17 LAB — ETHANOL: ETHYL ALCOHOL, SERUM: <0.01 %

## 2016-11-17 LAB — EKG 12-LEAD
Atrial Rate: 82 {beats}/min
P Axis: 64 degrees
P-R Interval: 132 ms
Q-T Interval: 390 ms
QRS Duration: 88 ms
QTc Calculation (Bazett): 455 ms
R Axis: 81 degrees
T Axis: 66 degrees
Ventricular Rate: 82 {beats}/min

## 2016-11-17 LAB — URINE DRUG SCREEN
Amphetamine+Methamphetamine Urine Screen: NEGATIVE
Barbiturate Quant, Ur: NEGATIVE
Benzodiazepine Quant, Ur: POSITIVE
Cannabinoid Quant, Ur: NEGATIVE
Cocaine Metab Quant, Ur: NEGATIVE
Opiates, Urine: NEGATIVE
Oxycodone: NEGATIVE
PCP Quant, Ur: NEGATIVE

## 2016-11-17 LAB — OSMOLALITY: Osmolality Calc: 275.6 mOsmol/kg (ref >=275.0)

## 2016-11-17 LAB — BASIC METABOLIC PANEL
BUN: 14 mg/dL (ref 7–22)
CO2: 19 meq/L — ABNORMAL LOW (ref 23–33)
Calcium: 8.9 mg/dL (ref 8.5–10.5)
Chloride: 104 meq/L (ref 98–111)
Creatinine: 0.7 mg/dL (ref 0.4–1.2)
Glucose: 89 mg/dL (ref 70–108)
Potassium: 2.8 meq/L — ABNORMAL LOW (ref 3.5–5.2)
Sodium: 138 meq/L (ref 135–145)

## 2016-11-17 LAB — LIPASE: Lipase: 24.8 U/L (ref 5.6–51.3)

## 2016-11-17 LAB — ANION GAP: Anion Gap: 15 meq/L (ref 8.0–16.0)

## 2016-11-17 LAB — HCG, SERUM, QUALITATIVE: Preg, Serum: NEGATIVE

## 2016-11-17 LAB — SALICYLATE LEVEL: Salicylate, Serum: <0.3 mg/dL — ABNORMAL LOW (ref 2.0–10.0)

## 2016-11-17 LAB — TSH: TSH: 2.18 u[IU]/mL (ref 0.400–4.20)

## 2016-11-17 LAB — TROPONIN: Troponin T: <0.01 ng/ml

## 2016-11-17 LAB — GLOMERULAR FILTRATION RATE, ESTIMATED: Est, Glom Filt Rate: >90 mL/min/{1.73_m2}

## 2016-11-17 MED ORDER — SODIUM CHLORIDE 0.9 % IV BOLUS
0.9 % | Freq: Once | INTRAVENOUS | Status: AC
Start: 2016-11-17 — End: 2016-11-17
  Administered 2016-11-17: 07:00:00 1000 mL via INTRAVENOUS

## 2016-11-17 MED ORDER — POTASSIUM CHLORIDE CRYS ER 20 MEQ PO TBCR
20 MEQ | Freq: Once | ORAL | Status: AC
Start: 2016-11-17 — End: 2016-11-17
  Administered 2016-11-17: 08:00:00 40 meq via ORAL

## 2016-11-17 MED ORDER — METOCLOPRAMIDE HCL 5 MG/ML IJ SOLN
5 MG/ML | Freq: Once | INTRAMUSCULAR | Status: AC
Start: 2016-11-17 — End: 2016-11-17
  Administered 2016-11-17: 08:00:00 10 mg via INTRAVENOUS

## 2016-11-17 MED ORDER — ACETAMINOPHEN 500 MG PO TABS
500 MG | Freq: Once | ORAL | Status: AC
Start: 2016-11-17 — End: 2016-11-17
  Administered 2016-11-17: 08:00:00 1000 mg via ORAL

## 2016-11-17 MED ORDER — DIPHENHYDRAMINE HCL 50 MG/ML IJ SOLN
50 MG/ML | Freq: Once | INTRAMUSCULAR | Status: AC
Start: 2016-11-17 — End: 2016-11-17
  Administered 2016-11-17: 08:00:00 25 mg via INTRAVENOUS

## 2016-11-17 MED FILL — DIPHENHYDRAMINE HCL 50 MG/ML IJ SOLN: 50 MG/ML | INTRAMUSCULAR | Qty: 1

## 2016-11-17 MED FILL — KLOR-CON M20 20 MEQ PO TBCR: 20 MEQ | ORAL | Qty: 2

## 2016-11-17 MED FILL — MAPAP 500 MG PO TABS: 500 MG | ORAL | Qty: 2

## 2016-11-17 MED FILL — METOCLOPRAMIDE HCL 5 MG/ML IJ SOLN: 5 MG/ML | INTRAMUSCULAR | Qty: 2
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
## 2016-11-17 NOTE — ED Notes (Signed)
Pt reassessed after pain medication given. Pt states that her pain is gone and she feels "very sleepy". Will continue to monitor.     Durenda Age [facility]  11/17/16 0515

## 2016-11-17 NOTE — ED Notes (Signed)
Pt reassessed. Pt given warm blanket for comfort. Will monitor.     Jennifer Burch  11/17/16 0421

## 2016-11-18 LAB — URINE CULTURE, REFLEXED

## 2016-11-23 ENCOUNTER — Ambulatory Visit: Admit: 2016-11-23 | Discharge: 2016-11-23 | Payer: PRIVATE HEALTH INSURANCE | Attending: Family | Primary: Family

## 2016-11-23 DIAGNOSIS — R55 Syncope and collapse: Secondary | ICD-10-CM

## 2016-11-23 MED ORDER — TOPIRAMATE 50 MG PO TABS
50 | ORAL_TABLET | Freq: Every evening | ORAL | 1 refills | Status: AC
Start: 2016-11-23 — End: ?
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
## 2016-11-23 NOTE — Progress Notes (Signed)
Visit Information    Have you changed or started any medications since your last visit including any over-the-counter medicines, vitamins, or herbal medicines? yes - medication list updated   Are you having any side effects from any of your medications? -  no  Have you stopped taking any of your medications? Is so, why? -  yes - medication list updated    Have you seen any other physician or provider since your last visit? No  Have you had any other diagnostic tests since your last visit? Yes - Records Obtained  Have you been seen in the emergency room and/or had an admission to a hospital since we last saw you? Yes - Records Obtained  Have you had your routine dental cleaning in the past 6 months? no    Have you activated your MyChart account? If not, what are your barriers? Yes     Patient Care Team:  Durwin Reges, CNP as PCP - General (Certified Nurse Practitioner)    Medical History Review  Past Medical, Family, and Social History reviewed and does contribute to the patient presenting condition    Health Maintenance   Topic Date Due   . HIV screen  12/18/2003   . DTaP/Tdap/Td vaccine (1 - Tdap) 12/18/2007   . Cervical cancer screen  10/27/2014   . Flu vaccine (Season Ended) 04/09/2017

## 2016-11-23 NOTE — Patient Instructions (Signed)
You may receive a survey about your visit with us today. The feedback from our patients helps us identify what is working well and where the service to all patients can be enhanced. Thank you!

## 2017-06-13 ENCOUNTER — Encounter: Payer: PRIVATE HEALTH INSURANCE | Attending: Family Medicine | Primary: Family

## 2017-07-04 ENCOUNTER — Ambulatory Visit: Admit: 2017-07-04 | Discharge: 2017-07-04 | Payer: PRIVATE HEALTH INSURANCE | Attending: Family | Primary: Family

## 2017-07-04 DIAGNOSIS — K130 Diseases of lips: Secondary | ICD-10-CM

## 2017-07-04 MED ORDER — TRIAMCINOLONE ACETONIDE 0.1 % EX CREA
0.1 % | CUTANEOUS | 0 refills | Status: AC
Start: 2017-07-04 — End: ?

## 2017-07-04 MED ORDER — CLOTRIMAZOLE-BETAMETHASONE 1-0.05 % EX CREA
CUTANEOUS | 1 refills | Status: AC
Start: 2017-07-04 — End: ?

## 2017-07-04 MED ORDER — MUPIROCIN CALCIUM 2 % EX CREA
2 % | CUTANEOUS | 0 refills | Status: AC
Start: 2017-07-04 — End: 2017-08-03

## 2017-07-04 NOTE — Telephone Encounter (Signed)
RA Ada RPH Jenny Bactroban cream not covered on insurance.  Bactroban Ointment is covered, medication changed to ointment. Noted for chart.  Please review and change medication list.

## 2017-07-04 NOTE — Patient Instructions (Signed)
You may receive a survey about your visit with us today. The feedback from our patients helps us identify what is working well and where the service to all patients can be enhanced. Thank you!

## 2017-07-04 NOTE — Progress Notes (Signed)
Visit Information    Have you changed or started any medications since your last visit including any over-the-counter medicines, vitamins, or herbal medicines? yes - see updated med list   Are you having any side effects from any of your medications? -  no  Have you stopped taking any of your medications? Is so, why? -  no    Have you seen any other physician or provider since your last visit? No  Have you had any other diagnostic tests since your last visit? No  Have you been seen in the emergency room and/or had an admission to a hospital since we last saw you? No  Have you had your routine dental cleaning in the past 6 months? no    Have you activated your MyChart account? If not, what are your barriers? Yes     Patient Care Team:  Durwin RegesEric W Amstutz, APRN - CNP as PCP - General (Certified Nurse Practitioner)  Durwin RegesEric W Amstutz, APRN - CNP as PCP - [facility] Attributed Provider    Medical History Review  Past Medical, Family, and Social History reviewed and does not contribute to the patient presenting condition    Health Maintenance   Topic Date Due   . HIV screen  12/18/2003   . DTaP/Tdap/Td vaccine (1 - Tdap) 12/18/2007   . Cervical cancer screen  10/27/2014   . Flu vaccine (1) 04/09/2017

## 2017-07-04 NOTE — Progress Notes (Signed)
Subjective:      Patient ID: Jennifer Burch is a 26 y.o. female.    HPI: Acute for nose soreness    Chief Complaint   Patient presents with   . Rash     nose, lips and hands   . Other     will get lumps on her body and after about a month they become very sore, she will stick a needle in them and puss will drain out they then will go away but will leave a scare.  Has had 3 in the past 3 months       Onset of 3-4 days with lips chapped and sore and red around nose.  Nostril feel swollen.  Runny nose.  Nostril tender to touch.  Chapped lips have cleared up.     Rash on right hand between 3rd and 4th digits.  Scaling rash. Comes and goes.  Response to jock itch cream.  Itches.     Has had 3 lumps in 3 months on chin, hip and abdomen.  Denies red bump.  Tender.  Stick a needle and will get pus out of.  Use of dial soap.  Feel lump but nothing change visually until she pokes it.       Review of Systems   Constitutional: Negative for chills and fever.   HENT: Negative.    Respiratory: Negative for cough and shortness of breath.    Cardiovascular: Negative for chest pain.   Gastrointestinal: Negative for abdominal pain and nausea.   Skin: Positive for rash.   Neurological: Negative for dizziness, light-headedness and headaches.   Psychiatric/Behavioral: Negative.        Objective:   Physical Exam   Constitutional: Vital signs are normal. No distress.   HENT:   Nose: Mucosal edema, rhinorrhea and sinus tenderness present.       Eyes: Pupils are equal, round, and reactive to light. EOM are normal.   Neck: Normal range of motion. Neck supple.   Cardiovascular: Normal rate and regular rhythm.    No murmur heard.  Pulmonary/Chest: Effort normal and breath sounds normal. She has no wheezes.   Abdominal: Soft. Bowel sounds are normal. She exhibits no distension. There is no tenderness.   Musculoskeletal: Normal range of motion. She exhibits no tenderness.        Hands:  Skin: Skin is warm and dry. No rash noted.       Assessment:        Diagnosis Orders   1. Cheilitis  triamcinolone (KENALOG) 0.1 % cream    mupirocin (BACTROBAN) 2 % cream   2. Tinea manus  clotrimazole-betamethasone (LOTRISONE) 1-0.05 % cream   3. Epidermoid cyst of skin             Plan:      Steroid Cream external irritation of nostril  Bactroban for internal irritation of nostril  Lotrison for hand rash - tinea  Discussion on #3 - avoid popping cyst  Best evaluated when develop a cyst - make appointment at that time  Recommend OTC Hibiclens soap  RTO if symptoms worsen or stay the same          Durwin RegesEric W Marijane Trower, APRN - CNP

## 2017-07-05 NOTE — Telephone Encounter (Signed)
Agree 

## 2017-07-06 ENCOUNTER — Telehealth

## 2017-07-06 MED ORDER — AZITHROMYCIN 250 MG PO TABS
250 MG | ORAL_TABLET | ORAL | 0 refills | Status: AC
Start: 2017-07-06 — End: 2017-07-16

## 2017-07-06 NOTE — Telephone Encounter (Signed)
Called and updated the patient.

## 2017-07-06 NOTE — Telephone Encounter (Signed)
Patient was seen on 11/26 by eric for a cold she states.  She feels like she has burning in her chest now.  She has a cough, runny nose, fever last night of 102.  Took tylenol.  No fever today.  She is asking if she could have an rx or does she need another appt?  Pharmacy is [facility]

## 2017-10-16 ENCOUNTER — Other Ambulatory Visit: Payer: Self-pay

## 2017-10-16 ENCOUNTER — Emergency Department
Admission: EM | Admit: 2017-10-16 | Discharge: 2017-10-16 | Disposition: A | Discharge status: Home / Self Care | Payer: Medicaid Other | Attending: Emergency Medicine | Admitting: Emergency Medicine

## 2017-10-16 ENCOUNTER — Emergency Department: Payer: Medicaid Other

## 2017-10-16 DIAGNOSIS — Z79899 Other long term (current) drug therapy: Secondary | ICD-10-CM | POA: Insufficient documentation

## 2017-10-16 DIAGNOSIS — R102 Pelvic and perineal pain: Secondary | ICD-10-CM

## 2017-10-16 DIAGNOSIS — Z7982 Long term (current) use of aspirin: Secondary | ICD-10-CM | POA: Diagnosis not present

## 2017-10-16 LAB — CBC
HCT: 38.4 % (ref 35.0–47.0)
HEMOGLOBIN: 13 g/dL (ref 12.0–16.0)
MCH: 29.7 pg (ref 26.0–34.0)
MCHC: 34 g/dL (ref 32.0–36.0)
MCV: 87.6 fL (ref 80.0–100.0)
Platelets: 236 10*3/uL (ref 150–440)
RBC: 4.38 MIL/uL (ref 3.80–5.20)
RDW: 12.9 % (ref 11.5–14.5)
WBC: 7.6 10*3/uL (ref 3.6–11.0)

## 2017-10-16 LAB — URINALYSIS, COMPLETE (UACMP) WITH MICROSCOPIC
BILIRUBIN URINE: NEGATIVE
Glucose, UA: NEGATIVE mg/dL
Hgb urine dipstick: NEGATIVE
KETONES UR: 20 mg/dL — AB
Nitrite: NEGATIVE
Protein, ur: 100 mg/dL — AB
Specific Gravity, Urine: 1.016 (ref 1.005–1.030)
pH: 5 (ref 5.0–8.0)

## 2017-10-16 LAB — COMPREHENSIVE METABOLIC PANEL
ALBUMIN: 4.3 g/dL (ref 3.5–5.0)
ALK PHOS: 51 U/L (ref 38–126)
ALT: 14 U/L (ref 14–54)
ANION GAP: 7 (ref 5–15)
AST: 17 U/L (ref 15–41)
BUN: 9 mg/dL (ref 6–20)
CALCIUM: 8.9 mg/dL (ref 8.9–10.3)
CHLORIDE: 113 mmol/L — AB (ref 101–111)
CO2: 17 mmol/L — AB (ref 22–32)
CREATININE: 0.62 mg/dL (ref 0.44–1.00)
GFR calc non Af Amer: >60 mL/min (ref >60)
GLUCOSE: 95 mg/dL (ref 65–99)
Potassium: 3.4 mmol/L — ABNORMAL LOW (ref 3.5–5.1)
SODIUM: 137 mmol/L (ref 135–145)
Total Bilirubin: 0.8 mg/dL (ref 0.3–1.2)
Total Protein: 7.2 g/dL (ref 6.5–8.1)

## 2017-10-16 LAB — CHLAMYDIA/NGC RT PCR (ARMC ONLY)
Chlamydia Tr: NOT DETECTED
N gonorrhoeae: NOT DETECTED

## 2017-10-16 LAB — HCG, QUANTITATIVE, PREGNANCY: HCG, BETA CHAIN, QUANT, S: 29013 m[IU]/mL — AB (ref <5)

## 2017-10-16 LAB — WET PREP, GENITAL
CLUE CELLS WET PREP: NONE SEEN
Sperm: NONE SEEN
TRICH WET PREP: NONE SEEN
YEAST WET PREP: NONE SEEN

## 2017-10-16 MED ORDER — CEFTRIAXONE SODIUM 250 MG IJ SOLR
250.0000 mg | Freq: Once | INTRAMUSCULAR | Status: AC
  Administered 2017-10-16: 250 mg via INTRAMUSCULAR
  Filled 2017-10-16: qty 250

## 2017-10-16 MED ORDER — AZITHROMYCIN 500 MG PO TABS
1000.0000 mg | ORAL_TABLET | Freq: Once | ORAL | Status: AC
  Administered 2017-10-16: 1000 mg via ORAL
  Filled 2017-10-16: qty 2

## 2017-10-16 MED ORDER — LIDOCAINE HCL (PF) 1 % IJ SOLN
INTRAMUSCULAR | Status: AC
  Administered 2017-10-16: 0.9 mL
  Filled 2017-10-16: qty 5

## 2017-10-16 NOTE — ED Notes (Signed)
Patient transported to Ultrasound 

## 2017-10-16 NOTE — ED Provider Notes (Signed)
[facility] Emergency Department Provider Note  ____________________________________________   First MD Initiated Contact with Patient 10/16/17 1709     (approximate)  I have reviewed the triage vital signs and the nursing notes.   HISTORY  Chief Complaint Pelvic Pain   HPI Joy Harris is a 27 y.o. female who self presents the emergency department with sharp stabbing constant lower left pelvic pain for the past several weeks.  The pain seems to be constant.  Moderate severity.  Nothing particular seems to make it better or worse.  She took a pregnancy test several days ago which was positive.  This was an unplanned and undesired pregnancy.  She does report some vaginal discharge.  She denies fevers or chills.  She was not taking any fertility medications.   History reviewed. No pertinent past medical history.  There are no active problems to display for this patient.   Past Surgical History:  Procedure Laterality Date  . TONSILLECTOMY      Prior to Admission medications   Medication Sig Start Date End Date Taking? Authorizing Provider  aspirin 325 MG tablet Take 650 mg by mouth daily.    [provider]  eletriptan (RELPAX) 20 MG tablet Take 1 tablet (20 mg total) by mouth as needed for migraine or headache. One tablet by mouth at onset of headache. May repeat in 2 hours if headache persists or recurs. 07/11/13   Muthersbaugh, Dahlia Client, PA-C  omeprazole (PRILOSEC) 20 MG capsule Take 1 capsule (20 mg total) by mouth daily. 07/11/13   Muthersbaugh, Dahlia Client, PA-C  ondansetron (ZOFRAN ODT) 8 MG disintegrating tablet 8mg  ODT q4 hours prn nausea 07/11/13   Muthersbaugh, Dahlia Client, PA-C  oxyCODONE-acetaminophen (PERCOCET/ROXICET) 5-325 MG per tablet Take 2 tablets by mouth every 4 (four) hours as needed for severe pain. 07/11/13   Muthersbaugh, Dahlia Client, PA-C    Allergies Tape  History reviewed. No pertinent family history.  Social History Social History    Tobacco Use  . Smoking status: Never Smoker  . Smokeless tobacco: Never Used  Substance Use Topics  . Alcohol use: No  . Drug use: No    Review of Systems Constitutional: No fever/chills Eyes: No visual changes. ENT: No sore throat. Cardiovascular: Denies chest pain. Respiratory: Denies shortness of breath. Gastrointestinal: Positive for abdominal pain.  No nausea, no vomiting.  No diarrhea.  No constipation. Genitourinary: Positive for discharge Musculoskeletal: Negative for back pain. Skin: Negative for rash. Neurological: Negative for headaches, focal weakness or numbness.   ____________________________________________   PHYSICAL EXAM:  VITAL SIGNS: ED Triage Vitals  Enc Vitals Group     BP 10/16/17 1517 98/64     Pulse Rate 10/16/17 1517 88     Resp 10/16/17 1517 18     Temp 10/16/17 1517 99.3 F (37.4 C)     Temp Source 10/16/17 1517 Oral     SpO2 10/16/17 1517 100 %     Weight 10/16/17 1518 135 lb (61.2 kg)     Height 10/16/17 1518 5\' 6"  (1.676 m)     Head Circumference --      Peak Flow --      Pain Score 10/16/17 1518 10     Pain Loc --      Pain Edu? --      Excl. in GC? --     Constitutional: Alert and oriented x4 pleasant cooperative speaks in full clear sentences no diaphoresis Eyes: PERRL EOMI. Head: Atraumatic. Nose: No congestion/rhinnorhea. Mouth/Throat: No trismus Neck:  No stridor.   Cardiovascular: Normal rate, regular rhythm. Grossly normal heart sounds.  Good peripheral circulation. Respiratory: Normal respiratory effort.  No retractions. Lungs CTAB and moving good air Gastrointestinal: Soft nondistended very mild left lower quadrant tenderness with no rebound or guarding no peritonitis Genital exam performed with female tech chaperone: Normal external exam copious amount of thick discharge in the vault eyes closed mild left adnexal tenderness Musculoskeletal: No lower extremity edema   Neurologic:  Normal speech and language. No gross  focal neurologic deficits are appreciated. Skin:  Skin is warm, dry and intact. No rash noted. Psychiatric: Mood and affect are normal. Speech and behavior are normal.    ____________________________________________   DIFFERENTIAL includes but not limited to  Pelvic inflammatory disease, gonorrhea, chlamydia, tubo-ovarian abscess, ectopic pregnancy ____________________________________________   LABS (all labs ordered are listed, but only abnormal results are displayed)  Labs Reviewed  COMPREHENSIVE METABOLIC PANEL - Abnormal; Notable for the following components:      Result Value   Potassium 3.4 (*)    Chloride 113 (*)    CO2 17 (*)    All other components within normal limits  HCG, QUANTITATIVE, PREGNANCY - Abnormal; Notable for the following components:   hCG, Beta Chain, Quant, S 29,013 (*)    All other components within normal limits  URINALYSIS, COMPLETE (UACMP) WITH MICROSCOPIC - Abnormal; Notable for the following components:   Color, Urine YELLOW (*)    APPearance CLOUDY (*)    Ketones, ur 20 (*)    Protein, ur 100 (*)    Leukocytes, UA TRACE (*)    Bacteria, UA RARE (*)    Squamous Epithelial / LPF 6-30 (*)    All other components within normal limits  CBC    Lab work reviewed by me shows the patient is pregnant and her gonorrhea and chlamydia are negative __________________________________________  EKG   ____________________________________________  RADIOLOGY  Pelvic ultrasound reviewed by me shows intrauterine pregnancy ____________________________________________   PROCEDURES  Procedure(s) performed: no  Procedures  Critical Care performed: no  Observation: no ____________________________________________   INITIAL IMPRESSION / ASSESSMENT AND PLAN / ED COURSE  Pertinent labs & imaging results that were available during my care of the patient were reviewed by me and considered in my medical decision making (see chart for details).  As  the patient has pelvic pain and is in her first trimester ultrasound performed which does confirm intrauterine pregnancy.  I performed a pelvic exam which was on visual inspection concerning for GC versus CT so I offer the patient watchful waiting with results later on today versus presumptive treatment she opted for treatment which I think is entirely reasonable.  I will call the patient back with results.  She is discharged home with OB follow-up and strict return precautions.  She verbalizes understanding and agreement with the plan.  I called the patient back to let her know the results of her testing is negative.  She understands [facility].  She does feel improved.      ____________________________________________   FINAL CLINICAL IMPRESSION(S) / ED DIAGNOSES  Final diagnoses:  Pelvic pain      NEW MEDICATIONS STARTED DURING THIS VISIT:  New Prescriptions   No medications on file     Note:  This document was prepared using Dragon voice recognition software and may include unintentional dictation errors.     Merrily Brittleifenbark, Wilbert Hayashi, MD 10/17/17 1737

## 2017-10-16 NOTE — Discharge Instructions (Signed)
Please follow-up with your OB gynecologist within 2 days for reexamination and return to the emergency department for any concerns whatsoever.  I have attached the resources we discussed below: [facility] Address: [address], [place], [place] [address] Phone: [phone]  [facility] Address: [address] [place], [place] [address] Phone: [phone]  It was a pleasure to take care of you today, and thank you for coming to our emergency department.  If you have any questions or concerns before leaving please ask the nurse to grab me and I'm more than happy to go through your aftercare instructions again.  If you were prescribed any opioid pain medication today such as Norco, Vicodin, Percocet, morphine, hydrocodone, or oxycodone please make sure you do not drive when you are taking this medication as it can alter your ability to drive safely.  If you have any concerns once you are home that you are not improving or are in fact getting worse before you can make it to your follow-up appointment, please do not hesitate to call 911 and come back for further evaluation.  Merrily Brittle, MD  Results for orders placed or performed during the hospital encounter of 10/16/17  Comprehensive metabolic panel  Result Value Ref Range   Sodium 137 135 - 145 mmol/L   Potassium 3.4 (L) 3.5 - 5.1 mmol/L   Chloride 113 (H) 101 - 111 mmol/L   CO2 17 (L) 22 - 32 mmol/L   Glucose, Bld 95 65 - 99 mg/dL   BUN 9 6 - 20 mg/dL   Creatinine, Ser 0.27 0.44 - 1.00 mg/dL   Calcium 8.9 8.9 - 25.3 mg/dL   Total Protein 7.2 6.5 - 8.1 g/dL   Albumin 4.3 3.5 - 5.0 g/dL   AST 17 15 - 41 U/L   ALT 14 14 - 54 U/L   Alkaline Phosphatase 51 38 - 126 U/L   Total Bilirubin 0.8 0.3 - 1.2 mg/dL   GFR calc non Af Amer >60 >60 mL/min   GFR calc Af Amer >60 >60 mL/min   Anion gap 7 5 - 15  CBC  Result Value Ref Range   WBC 7.6 3.6 - 11.0 K/uL   RBC 4.38 3.80 - 5.20 MIL/uL   Hemoglobin 13.0 12.0 - 16.0 g/dL   HCT 66.4 40.3 - 47.4 %   MCV 87.6 80.0 - 100.0 fL   MCH 29.7 26.0 - 34.0 pg   MCHC 34.0 32.0 - 36.0 g/dL   RDW 25.9 56.3 - 87.5 %   Platelets 236 150 - 440 K/uL  hCG, quantitative, pregnancy  Result Value Ref Range   hCG, Beta Chain, Quant, S 29,013 (H) <5 mIU/mL  Urinalysis, Complete w Microscopic  Result Value Ref Range   Color, Urine YELLOW (A) YELLOW   APPearance CLOUDY (A) CLEAR   Specific Gravity, Urine 1.016 1.005 - 1.030   pH 5.0 5.0 - 8.0   Glucose, UA NEGATIVE NEGATIVE mg/dL   Hgb urine dipstick NEGATIVE NEGATIVE   Bilirubin Urine NEGATIVE NEGATIVE   Ketones, ur 20 (A) NEGATIVE mg/dL   Protein, ur 643 (A) NEGATIVE mg/dL   Nitrite NEGATIVE NEGATIVE   Leukocytes, UA TRACE (A) NEGATIVE   RBC / HPF 0-5 0 - 5 RBC/hpf   WBC, UA 6-30 0 - 5 WBC/hpf   Bacteria, UA RARE (A) NONE SEEN   Squamous Epithelial / LPF 6-30 (A) NONE SEEN   Mucus PRESENT  [place] Ob Comp < 14 Wks  Result Date: 10/16/2017 CLINICAL DATA:  Pelvic pain for 2 weeks EXAM: OBSTETRIC <14 WK US AND TRANSVAGINAL OB US TECHNIQUE: Both transabdominal and transvaginal ultrasound examinations were performed for complete evaluation of the gestation as well as the maternal uterus, adnexal regions, and pelvic cul-de-sac. Transvaginal technique was performed to assess early pregnancy. COMPARISON:  None. FINDINGS: Intrauterine gestational sac: Single Yolk sac:  Visualized. Embryo:  Not Visualized. MSD: 13.4 mm   6 w   1 d Subchorionic hemorrhage:  None visualized. Maternal uterus/adnexae: The ovaries are normal in appearance. A small amount of physiologic fluid is seen in the pelvis. IMPRESSION: Single IUP. A gestational sac and yolk sac are seen. No fetal pole at this time. Recommend attention on follow-up. Electronically Signed   By: Gerome Samavid  Williams III M.D   On: 10/16/2017 17:24   [place] Ob Transvaginal  Result Date: 10/16/2017 CLINICAL DATA:  Pelvic pain for 2  weeks EXAM: OBSTETRIC <14 WK US AND TRANSVAGINAL OB US TECHNIQUE: Both transabdominal and transvaginal ultrasound examinations were performed for complete evaluation of the gestation as well as the maternal uterus, adnexal regions, and pelvic cul-de-sac. Transvaginal technique was performed to assess early pregnancy. COMPARISON:  None. FINDINGS: Intrauterine gestational sac: Single Yolk sac:  Visualized. Embryo:  Not Visualized. MSD: 13.4 mm   6 w   1 d Subchorionic hemorrhage:  None visualized. Maternal uterus/adnexae: The ovaries are normal in appearance. A small amount of physiologic fluid is seen in the pelvis. IMPRESSION: Single IUP. A gestational sac and yolk sac are seen. No fetal pole at this time. Recommend attention on follow-up. Electronically Signed   By: Gerome Samavid  Williams III M.D   On: 10/16/2017 17:24

## 2017-10-16 NOTE — ED Triage Notes (Addendum)
Pt states took pregnancy test on Wednesday which was positive. Pt states stabbing L sided pain at uterus since end of February. Denies bleeding. Pt states numbness in hands, cold, and sweating x couple weeks. States Thursday pain began to get worse. Sister in law with pt. Has not established OB care yet. 3rd pregnancy. 1 living child. Miscarriage.

## 2019-12-09 IMAGING — US US OB TRANSVAGINAL
1 series · 14 of 28 positions shown · non-contrast
Comparison: None.

CLINICAL DATA: Pelvic pain for 2 weeks

EXAM:
OBSTETRIC <14 WK US AND TRANSVAGINAL OB US
TECHNIQUE: Both transabdominal and transvaginal ultrasound examinations were
performed for complete evaluation of the gestation as well as the
maternal uterus, adnexal regions, and pelvic cul-de-sac.
Transvaginal technique was performed to assess early pregnancy.

[Series 1: us ob transvaginal · 0.13mm/px · 14 of 56 slices shown]
[im 3/56]
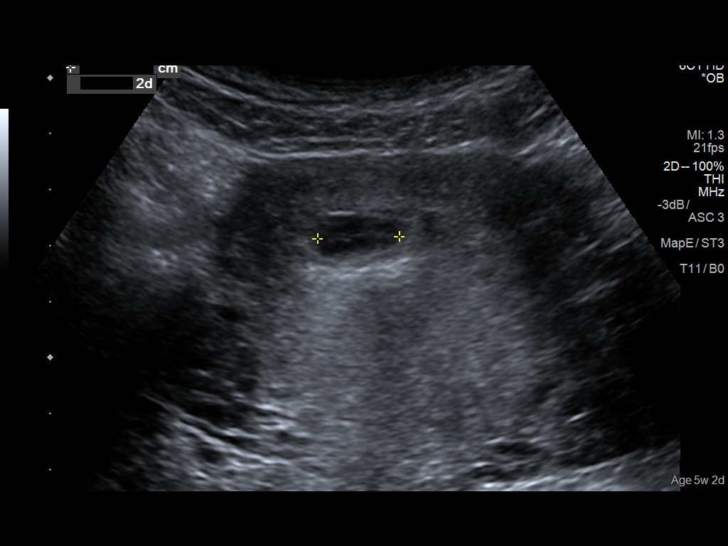
[im 7/56]
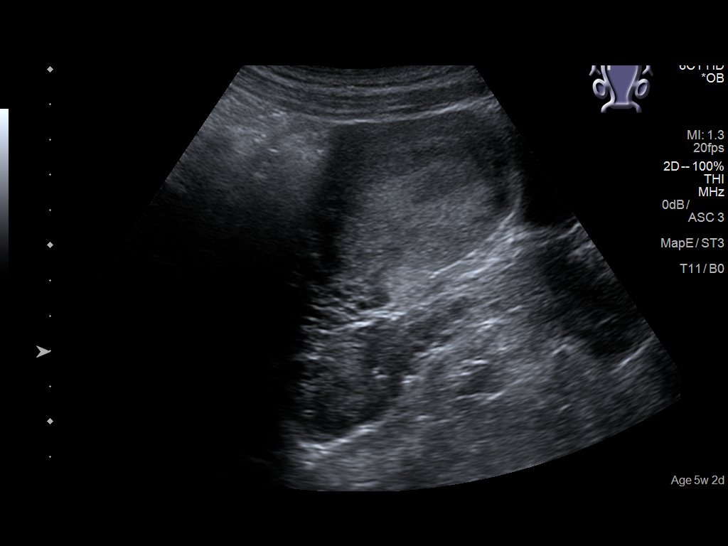
[im 11/56]
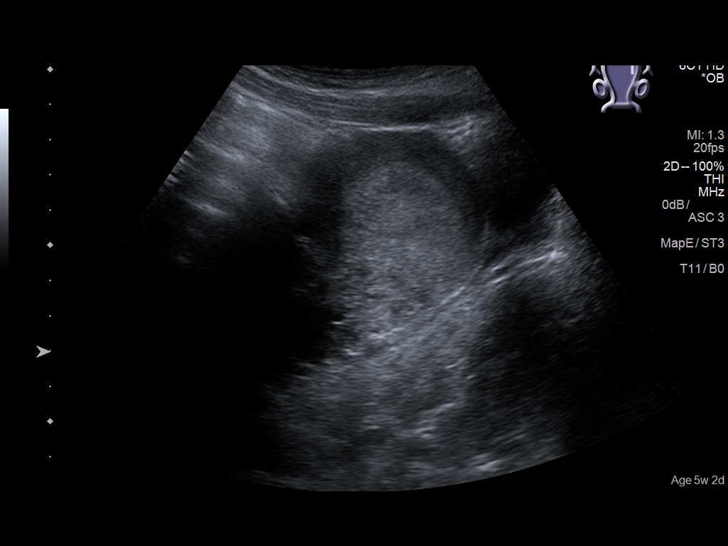
[im 15/56]
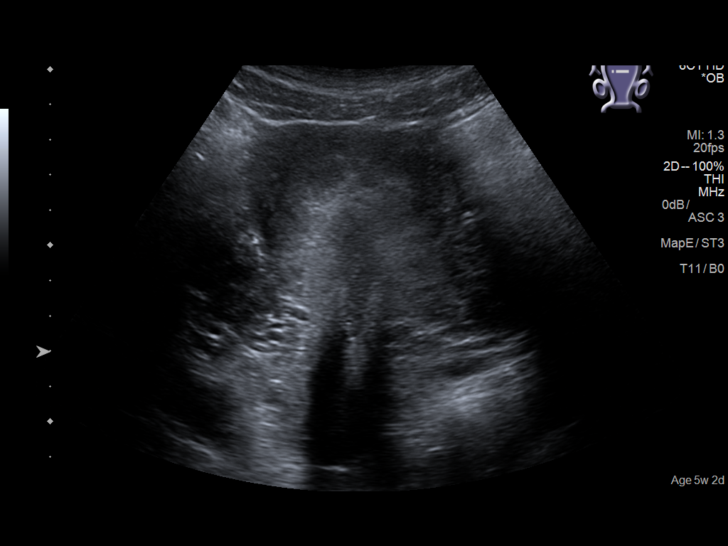
[im 19/56]
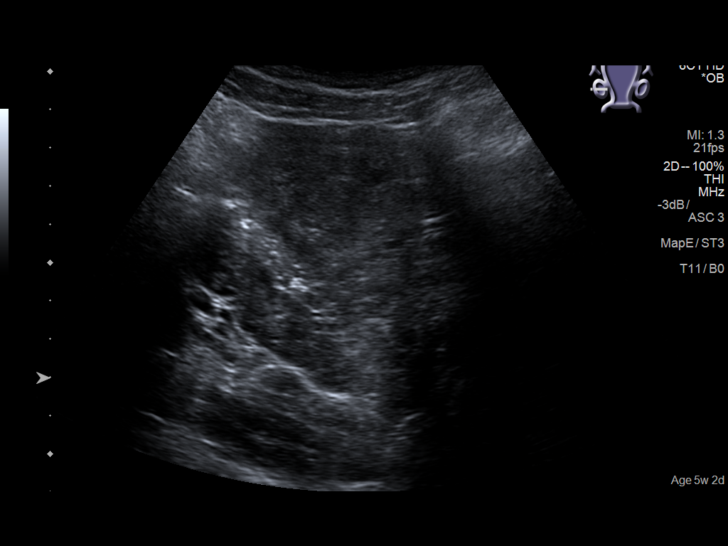
[im 23/56]
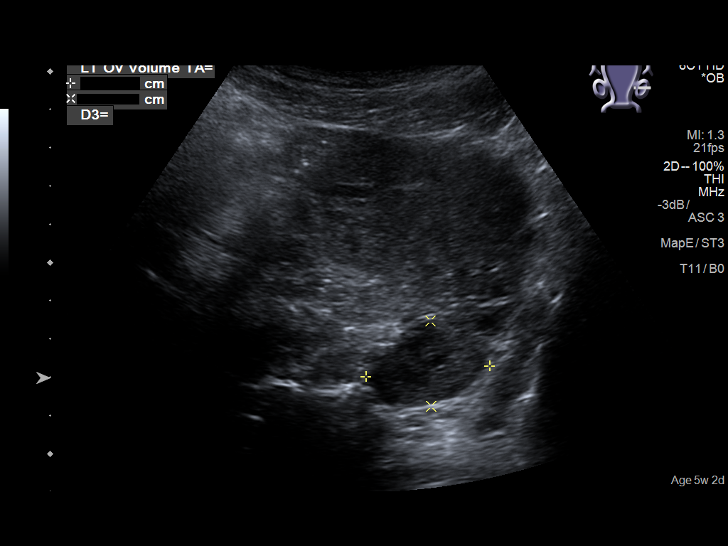
[im 27/56]
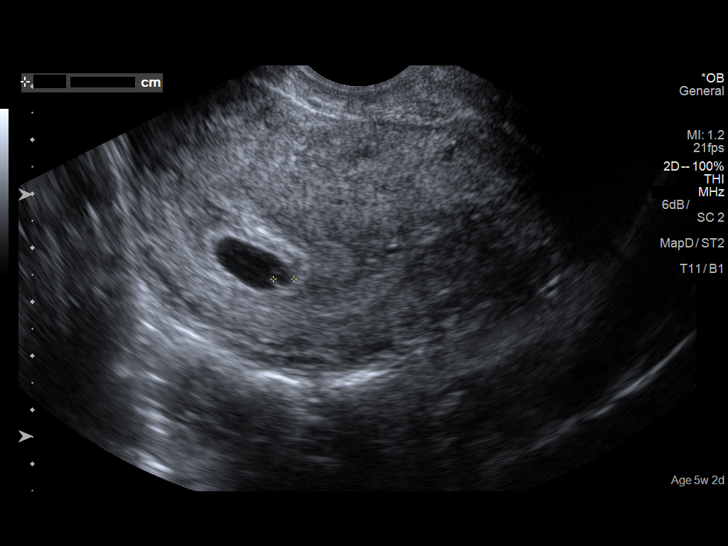
[im 31/56]
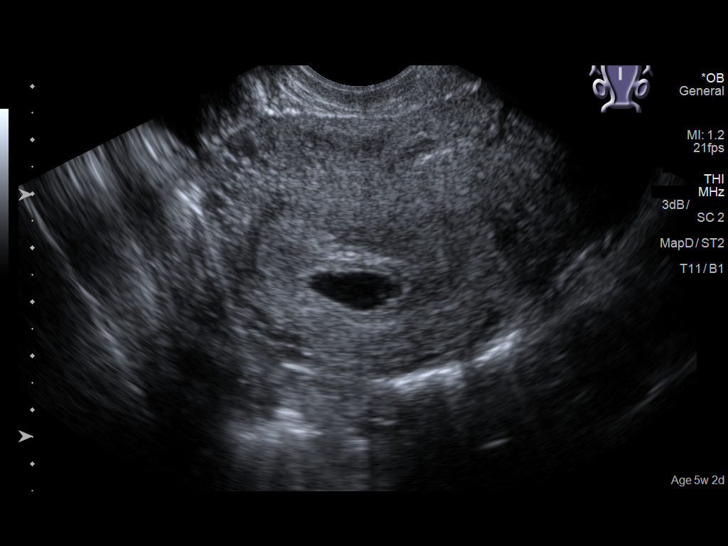
[im 35/56]
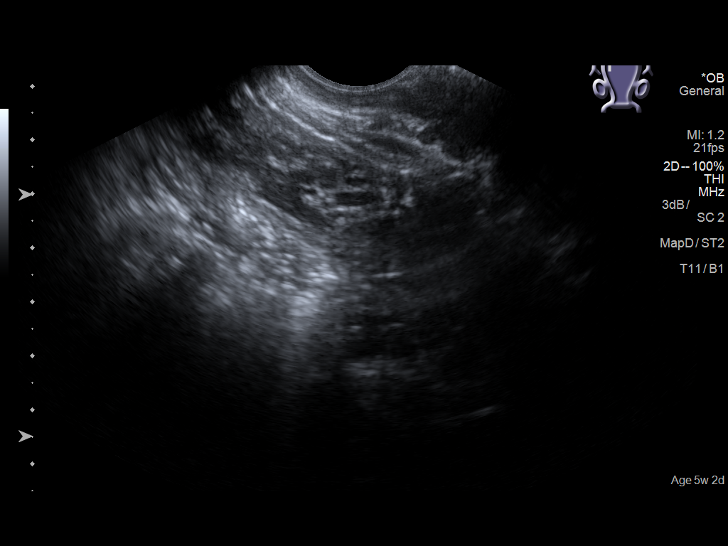
[im 39/56]
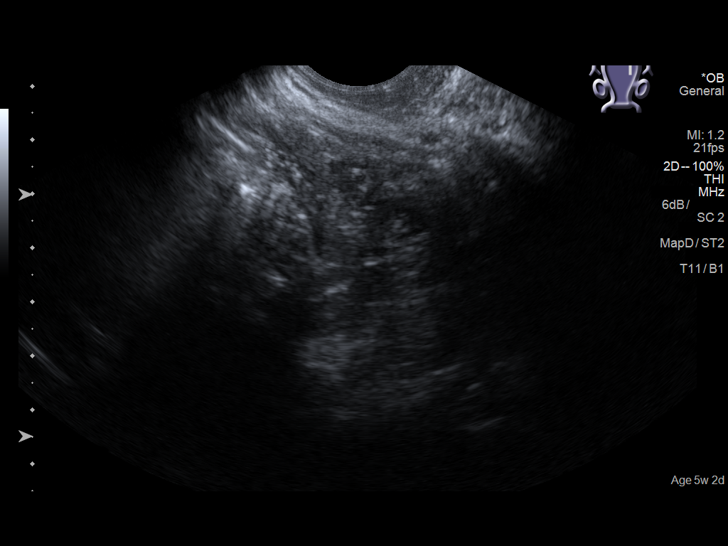
[im 43/56]
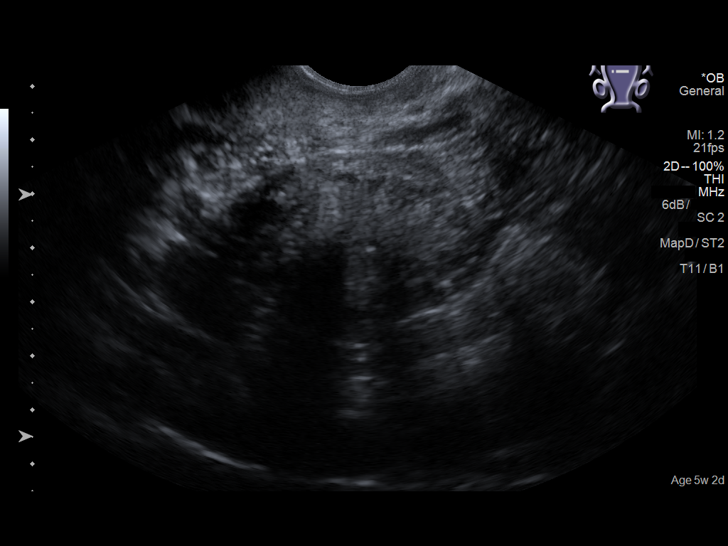
[im 47/56]
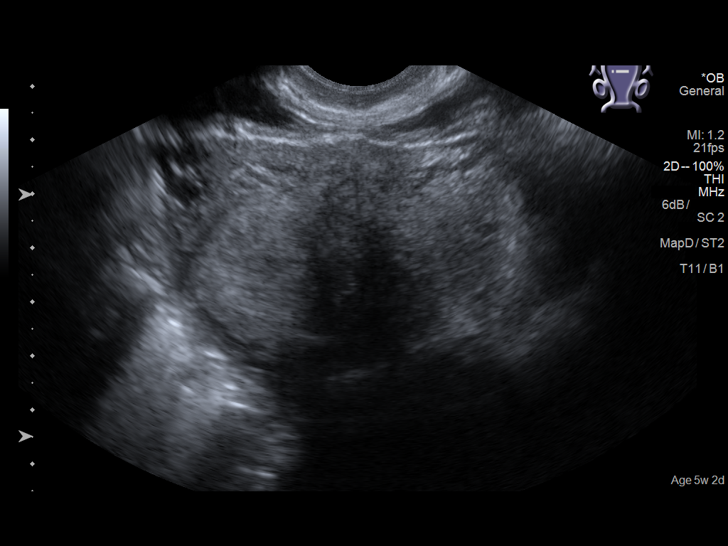
[im 51/56]
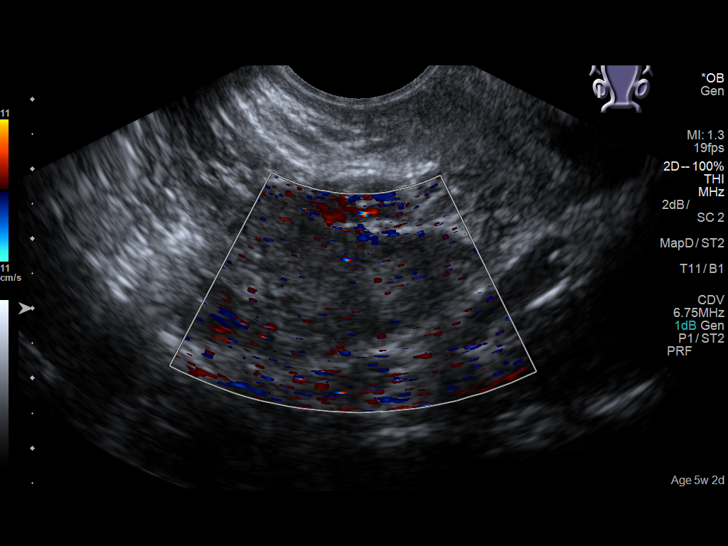
[im 56/56]
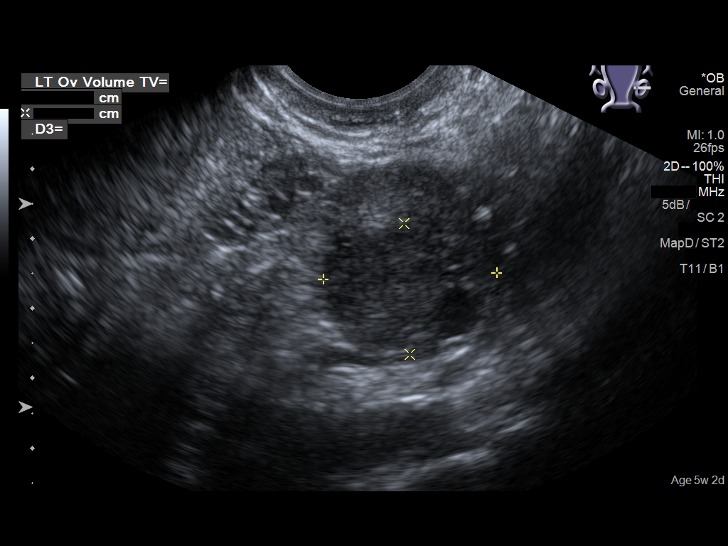

[14 of 28 positions shown; findings below may reference images not displayed]

FINDINGS: Intrauterine gestational sac: Single

Yolk sac:  Visualized.

Embryo:  Not Visualized.

MSD: 13.4 mm   6 w   1 d

Subchorionic hemorrhage:  None visualized.

Maternal uterus/adnexae: The ovaries are normal in appearance. A
small amount of physiologic fluid is seen in the pelvis.
IMPRESSION: Single IUP. A gestational sac and yolk sac are seen. No fetal pole
at this time. Recommend attention on follow-up.
